# Patient Record
Sex: Female | Born: 1980 | Race: White | Hispanic: No | Smoking: Current every day smoker
Health system: Southern US, Community
[De-identification: ages and names within clinical notes are randomized; demographics above are authoritative.]

## PROBLEM LIST (undated history)

## (undated) DIAGNOSIS — R109 Unspecified abdominal pain: Secondary | ICD-10-CM

## (undated) DIAGNOSIS — F4310 Post-traumatic stress disorder, unspecified: Secondary | ICD-10-CM

## (undated) DIAGNOSIS — R42 Dizziness and giddiness: Secondary | ICD-10-CM

## (undated) DIAGNOSIS — F410 Panic disorder [episodic paroxysmal anxiety] without agoraphobia: Secondary | ICD-10-CM

## (undated) DIAGNOSIS — Z6841 Body Mass Index (BMI) 40.0 and over, adult: Secondary | ICD-10-CM

## (undated) DIAGNOSIS — N809 Endometriosis, unspecified: Secondary | ICD-10-CM

## (undated) DIAGNOSIS — F329 Major depressive disorder, single episode, unspecified: Secondary | ICD-10-CM

## (undated) DIAGNOSIS — J42 Unspecified chronic bronchitis: Secondary | ICD-10-CM

## (undated) DIAGNOSIS — F419 Anxiety disorder, unspecified: Secondary | ICD-10-CM

## (undated) HISTORY — DX: Morbid (severe) obesity due to excess calories: E66.01

## (undated) HISTORY — DX: Anxiety disorder, unspecified: F41.9

## (undated) HISTORY — DX: Dizziness and giddiness: R42

## (undated) HISTORY — DX: Unspecified abdominal pain: R10.9

## (undated) HISTORY — DX: Major depressive disorder, single episode, unspecified: F32.9

## (undated) HISTORY — DX: Body Mass Index (BMI) 40.0 and over, adult: Z684

---
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
## 2003-03-13 ENCOUNTER — Other Ambulatory Visit: Admission: RE | Admit: 2003-03-13 | Discharge: 2003-03-13 | Payer: Self-pay | Admitting: Gynecology

## 2003-06-04 HISTORY — PX: SALPINGECTOMY: SHX328

## 2003-06-14 ENCOUNTER — Ambulatory Visit (HOSPITAL_COMMUNITY): Admission: RE | Admit: 2003-06-14 | Discharge: 2003-06-14 | Payer: Self-pay | Admitting: Internal Medicine

## 2003-06-14 ENCOUNTER — Encounter (INDEPENDENT_AMBULATORY_CARE_PROVIDER_SITE_OTHER): Payer: Self-pay | Admitting: *Deleted

## 2004-09-09 ENCOUNTER — Emergency Department (HOSPITAL_COMMUNITY): Admission: EM | Admit: 2004-09-09 | Discharge: 2004-09-09 | Payer: Self-pay | Admitting: Emergency Medicine

## 2005-11-26 ENCOUNTER — Emergency Department (HOSPITAL_COMMUNITY): Admission: EM | Admit: 2005-11-26 | Discharge: 2005-11-26 | Payer: Self-pay | Admitting: Family Medicine

## 2005-11-30 ENCOUNTER — Emergency Department (HOSPITAL_COMMUNITY): Admission: EM | Admit: 2005-11-30 | Discharge: 2005-11-30 | Payer: Self-pay | Admitting: Family Medicine

## 2006-01-24 ENCOUNTER — Inpatient Hospital Stay (HOSPITAL_COMMUNITY): Admission: AD | Admit: 2006-01-24 | Discharge: 2006-01-25 | Payer: Self-pay | Admitting: Gynecology

## 2006-01-26 ENCOUNTER — Inpatient Hospital Stay (HOSPITAL_COMMUNITY): Admission: AD | Admit: 2006-01-26 | Discharge: 2006-01-26 | Payer: Self-pay | Admitting: Gynecology

## 2006-02-02 ENCOUNTER — Inpatient Hospital Stay (HOSPITAL_COMMUNITY): Admission: AD | Admit: 2006-02-02 | Discharge: 2006-02-02 | Payer: Self-pay | Admitting: Obstetrics and Gynecology

## 2006-02-08 ENCOUNTER — Emergency Department (HOSPITAL_COMMUNITY): Admission: EM | Admit: 2006-02-08 | Discharge: 2006-02-08 | Payer: Self-pay | Admitting: Emergency Medicine

## 2006-02-11 ENCOUNTER — Other Ambulatory Visit: Admission: RE | Admit: 2006-02-11 | Discharge: 2006-02-11 | Payer: Self-pay | Admitting: Gynecology

## 2006-03-26 ENCOUNTER — Emergency Department (HOSPITAL_COMMUNITY): Admission: EM | Admit: 2006-03-26 | Discharge: 2006-03-26 | Payer: Self-pay | Admitting: Emergency Medicine

## 2006-05-05 ENCOUNTER — Emergency Department (HOSPITAL_COMMUNITY): Admission: EM | Admit: 2006-05-05 | Discharge: 2006-05-05 | Payer: Self-pay | Admitting: Family Medicine

## 2006-05-11 ENCOUNTER — Emergency Department (HOSPITAL_COMMUNITY): Admission: EM | Admit: 2006-05-11 | Discharge: 2006-05-11 | Payer: Self-pay | Admitting: Family Medicine

## 2006-07-01 ENCOUNTER — Emergency Department (HOSPITAL_COMMUNITY): Admission: EM | Admit: 2006-07-01 | Discharge: 2006-07-01 | Payer: Self-pay | Admitting: Family Medicine

## 2006-07-04 ENCOUNTER — Emergency Department (HOSPITAL_COMMUNITY): Admission: EM | Admit: 2006-07-04 | Discharge: 2006-07-04 | Payer: Self-pay | Admitting: Emergency Medicine

## 2006-07-09 ENCOUNTER — Ambulatory Visit: Payer: Self-pay | Admitting: Family Medicine

## 2006-07-09 ENCOUNTER — Encounter (INDEPENDENT_AMBULATORY_CARE_PROVIDER_SITE_OTHER): Payer: Self-pay | Admitting: Internal Medicine

## 2006-07-15 ENCOUNTER — Ambulatory Visit: Payer: Self-pay | Admitting: Family Medicine

## 2006-09-23 ENCOUNTER — Emergency Department (HOSPITAL_COMMUNITY): Admission: EM | Admit: 2006-09-23 | Discharge: 2006-09-23 | Payer: Self-pay | Admitting: Family Medicine

## 2007-05-12 ENCOUNTER — Observation Stay: Payer: Self-pay | Admitting: Obstetrics & Gynecology

## 2007-07-03 ENCOUNTER — Inpatient Hospital Stay: Payer: Self-pay

## 2007-09-20 ENCOUNTER — Ambulatory Visit: Payer: Self-pay | Admitting: Family Medicine

## 2007-09-20 DIAGNOSIS — F329 Major depressive disorder, single episode, unspecified: Secondary | ICD-10-CM

## 2007-09-20 DIAGNOSIS — G43009 Migraine without aura, not intractable, without status migrainosus: Secondary | ICD-10-CM | POA: Insufficient documentation

## 2007-09-20 DIAGNOSIS — F3289 Other specified depressive episodes: Secondary | ICD-10-CM

## 2007-09-20 HISTORY — DX: Other specified depressive episodes: F32.89

## 2007-09-20 HISTORY — DX: Major depressive disorder, single episode, unspecified: F32.9

## 2007-09-21 ENCOUNTER — Telehealth (INDEPENDENT_AMBULATORY_CARE_PROVIDER_SITE_OTHER): Payer: Self-pay | Admitting: Internal Medicine

## 2007-09-27 ENCOUNTER — Ambulatory Visit: Payer: Self-pay | Admitting: Internal Medicine

## 2007-09-29 ENCOUNTER — Telehealth (INDEPENDENT_AMBULATORY_CARE_PROVIDER_SITE_OTHER): Payer: Self-pay | Admitting: *Deleted

## 2007-10-25 ENCOUNTER — Ambulatory Visit: Payer: Self-pay | Admitting: Family Medicine

## 2007-10-25 DIAGNOSIS — G4700 Insomnia, unspecified: Secondary | ICD-10-CM

## 2007-10-31 ENCOUNTER — Telehealth (INDEPENDENT_AMBULATORY_CARE_PROVIDER_SITE_OTHER): Payer: Self-pay | Admitting: Internal Medicine

## 2007-11-22 ENCOUNTER — Telehealth (INDEPENDENT_AMBULATORY_CARE_PROVIDER_SITE_OTHER): Payer: Self-pay | Admitting: Internal Medicine

## 2007-12-01 ENCOUNTER — Ambulatory Visit: Payer: Self-pay | Admitting: Family Medicine

## 2007-12-01 DIAGNOSIS — R062 Wheezing: Secondary | ICD-10-CM

## 2007-12-01 DIAGNOSIS — J069 Acute upper respiratory infection, unspecified: Secondary | ICD-10-CM | POA: Insufficient documentation

## 2007-12-02 ENCOUNTER — Telehealth: Payer: Self-pay | Admitting: Family Medicine

## 2007-12-05 ENCOUNTER — Telehealth (INDEPENDENT_AMBULATORY_CARE_PROVIDER_SITE_OTHER): Payer: Self-pay | Admitting: Internal Medicine

## 2007-12-07 DIAGNOSIS — N809 Endometriosis, unspecified: Secondary | ICD-10-CM | POA: Insufficient documentation

## 2007-12-07 DIAGNOSIS — J45909 Unspecified asthma, uncomplicated: Secondary | ICD-10-CM | POA: Insufficient documentation

## 2007-12-07 DIAGNOSIS — T782XXA Anaphylactic shock, unspecified, initial encounter: Secondary | ICD-10-CM

## 2007-12-08 ENCOUNTER — Telehealth (INDEPENDENT_AMBULATORY_CARE_PROVIDER_SITE_OTHER): Payer: Self-pay | Admitting: Internal Medicine

## 2007-12-12 ENCOUNTER — Encounter: Payer: Self-pay | Admitting: Family Medicine

## 2008-01-04 ENCOUNTER — Ambulatory Visit: Payer: Self-pay | Admitting: Family Medicine

## 2008-01-04 DIAGNOSIS — J029 Acute pharyngitis, unspecified: Secondary | ICD-10-CM | POA: Insufficient documentation

## 2008-02-01 ENCOUNTER — Telehealth (INDEPENDENT_AMBULATORY_CARE_PROVIDER_SITE_OTHER): Payer: Self-pay | Admitting: Internal Medicine

## 2008-02-08 ENCOUNTER — Telehealth (INDEPENDENT_AMBULATORY_CARE_PROVIDER_SITE_OTHER): Payer: Self-pay | Admitting: Internal Medicine

## 2008-02-08 ENCOUNTER — Encounter (INDEPENDENT_AMBULATORY_CARE_PROVIDER_SITE_OTHER): Payer: Self-pay | Admitting: Internal Medicine

## 2008-03-06 ENCOUNTER — Ambulatory Visit: Payer: Self-pay | Admitting: Family Medicine

## 2008-04-10 ENCOUNTER — Encounter (INDEPENDENT_AMBULATORY_CARE_PROVIDER_SITE_OTHER): Payer: Self-pay | Admitting: *Deleted

## 2008-04-12 ENCOUNTER — Telehealth (INDEPENDENT_AMBULATORY_CARE_PROVIDER_SITE_OTHER): Payer: Self-pay | Admitting: Internal Medicine

## 2008-04-12 ENCOUNTER — Encounter (INDEPENDENT_AMBULATORY_CARE_PROVIDER_SITE_OTHER): Payer: Self-pay | Admitting: Internal Medicine

## 2008-05-21 ENCOUNTER — Telehealth: Payer: Self-pay | Admitting: Family Medicine

## 2008-05-28 ENCOUNTER — Telehealth: Payer: Self-pay | Admitting: Family Medicine

## 2008-06-06 ENCOUNTER — Emergency Department: Payer: Self-pay | Admitting: Emergency Medicine

## 2008-11-13 ENCOUNTER — Ambulatory Visit: Payer: Self-pay | Admitting: Family Medicine

## 2008-12-03 ENCOUNTER — Encounter: Payer: Self-pay | Admitting: Family Medicine

## 2009-01-01 ENCOUNTER — Encounter: Payer: Self-pay | Admitting: Family Medicine

## 2009-01-24 ENCOUNTER — Emergency Department (HOSPITAL_COMMUNITY): Admission: EM | Admit: 2009-01-24 | Discharge: 2009-01-24 | Payer: Self-pay | Admitting: Family Medicine

## 2009-01-31 ENCOUNTER — Encounter: Payer: Self-pay | Admitting: Family Medicine

## 2009-04-09 ENCOUNTER — Emergency Department (HOSPITAL_COMMUNITY): Admission: EM | Admit: 2009-04-09 | Discharge: 2009-04-09 | Payer: Self-pay | Admitting: Emergency Medicine

## 2009-09-19 ENCOUNTER — Emergency Department (HOSPITAL_COMMUNITY): Admission: EM | Admit: 2009-09-19 | Discharge: 2009-09-19 | Payer: Self-pay | Admitting: Family Medicine

## 2009-09-20 ENCOUNTER — Ambulatory Visit: Payer: Self-pay | Admitting: Family Medicine

## 2009-10-23 ENCOUNTER — Ambulatory Visit: Payer: Self-pay | Admitting: Family Medicine

## 2010-01-01 HISTORY — PX: CHOLECYSTECTOMY: SHX55

## 2010-01-30 ENCOUNTER — Inpatient Hospital Stay (HOSPITAL_COMMUNITY): Admission: EM | Admit: 2010-01-30 | Discharge: 2010-02-01 | Payer: Self-pay | Admitting: Emergency Medicine

## 2010-01-31 ENCOUNTER — Encounter (INDEPENDENT_AMBULATORY_CARE_PROVIDER_SITE_OTHER): Payer: Self-pay | Admitting: Surgery

## 2010-06-10 ENCOUNTER — Emergency Department (HOSPITAL_COMMUNITY): Admission: EM | Admit: 2010-06-10 | Discharge: 2010-06-10 | Payer: Self-pay | Admitting: Family Medicine

## 2010-07-25 ENCOUNTER — Emergency Department (HOSPITAL_COMMUNITY)
Admission: EM | Admit: 2010-07-25 | Discharge: 2010-07-25 | Payer: Self-pay | Source: Home / Self Care | Admitting: Emergency Medicine

## 2010-10-06 ENCOUNTER — Emergency Department (HOSPITAL_COMMUNITY)
Admission: EM | Admit: 2010-10-06 | Discharge: 2010-10-06 | Disposition: A | Discharge status: Home / Self Care | Payer: BC Managed Care – PPO | Attending: Emergency Medicine | Admitting: Emergency Medicine

## 2010-10-06 ENCOUNTER — Emergency Department (HOSPITAL_COMMUNITY): Payer: BC Managed Care – PPO

## 2010-10-06 DIAGNOSIS — N949 Unspecified condition associated with female genital organs and menstrual cycle: Secondary | ICD-10-CM | POA: Insufficient documentation

## 2010-10-06 DIAGNOSIS — N898 Other specified noninflammatory disorders of vagina: Secondary | ICD-10-CM | POA: Insufficient documentation

## 2010-10-06 LAB — URINALYSIS, ROUTINE W REFLEX MICROSCOPIC
Ketones, ur: NEGATIVE mg/dL
Leukocytes, UA: NEGATIVE
Nitrite: NEGATIVE
Protein, ur: NEGATIVE mg/dL

## 2010-10-06 LAB — PREGNANCY, URINE: Preg Test, Ur: NEGATIVE

## 2010-10-06 LAB — WET PREP, GENITAL: Clue Cells Wet Prep HPF POC: NONE SEEN

## 2010-10-09 LAB — GC/CHLAMYDIA PROBE AMP, GENITAL: Chlamydia, DNA Probe: NEGATIVE

## 2010-10-14 LAB — POCT URINALYSIS DIPSTICK
Glucose, UA: NEGATIVE mg/dL
Nitrite: NEGATIVE
Urobilinogen, UA: 0.2 mg/dL (ref 0.0–1.0)

## 2010-10-14 LAB — POCT PREGNANCY, URINE: Preg Test, Ur: NEGATIVE

## 2010-10-19 LAB — COMPREHENSIVE METABOLIC PANEL
ALT: 13 U/L (ref 0–35)
AST: 16 U/L (ref 0–37)
Albumin: 2.9 g/dL — ABNORMAL LOW (ref 3.5–5.2)
Alkaline Phosphatase: 40 U/L (ref 39–117)
BUN: 3 mg/dL — ABNORMAL LOW (ref 6–23)
CO2: 26 mEq/L (ref 19–32)
Calcium: 7.9 mg/dL — ABNORMAL LOW (ref 8.4–10.5)
Chloride: 106 mEq/L (ref 96–112)
Creatinine, Ser: 0.82 mg/dL (ref 0.4–1.2)
GFR calc Af Amer: >60 mL/min (ref >60)
GFR calc non Af Amer: >60 mL/min (ref >60)
Glucose, Bld: 110 mg/dL — ABNORMAL HIGH (ref 70–99)
Potassium: 4.4 mEq/L (ref 3.5–5.1)
Sodium: 138 mEq/L (ref 135–145)
Total Bilirubin: 0.3 mg/dL (ref 0.3–1.2)
Total Protein: 5.2 g/dL — ABNORMAL LOW (ref 6.0–8.3)

## 2010-10-19 LAB — URINALYSIS, ROUTINE W REFLEX MICROSCOPIC
Hgb urine dipstick: NEGATIVE
Ketones, ur: 15 mg/dL — AB
Protein, ur: NEGATIVE mg/dL
Urobilinogen, UA: 1 mg/dL (ref 0.0–1.0)

## 2010-10-19 LAB — CBC
HCT: 39.2 % (ref 36.0–46.0)
Hemoglobin: 13.5 g/dL (ref 12.0–15.0)
MCH: 31.3 pg (ref 26.0–34.0)
MCV: 91.2 fL (ref 78.0–100.0)
MCV: 90.7 fL (ref 78.0–100.0)
Platelets: 251 10*3/uL (ref 150–400)
Platelets: 298 10*3/uL (ref 150–400)
RBC: 4.32 MIL/uL (ref 3.87–5.11)
RDW: 13.7 % (ref 11.5–15.5)
WBC: 8.4 10*3/uL (ref 4.0–10.5)

## 2010-10-19 LAB — LIPASE, BLOOD: Lipase: 34 U/L (ref 11–59)

## 2010-10-19 LAB — DIFFERENTIAL
Basophils Absolute: 0 10*3/uL (ref 0.0–0.1)
Eosinophils Absolute: 0.2 10*3/uL (ref 0.0–0.7)
Eosinophils Relative: 2 % (ref 0–5)
Neutrophils Relative %: 64 % (ref 43–77)

## 2010-10-19 LAB — POCT PREGNANCY, URINE: Preg Test, Ur: NEGATIVE

## 2010-10-22 LAB — POCT URINALYSIS DIP (DEVICE)
Nitrite: NEGATIVE
Protein, ur: 100 mg/dL — AB
Urobilinogen, UA: 1 mg/dL (ref 0.0–1.0)
pH: 5.5 (ref 5.0–8.0)

## 2010-10-22 LAB — CBC
MCHC: 34.5 g/dL (ref 30.0–36.0)
MCV: 89.1 fL (ref 78.0–100.0)
RBC: 4.57 MIL/uL (ref 3.87–5.11)
RDW: 14 % (ref 11.5–15.5)

## 2010-10-22 LAB — DIFFERENTIAL
Basophils Relative: 0 % (ref 0–1)
Eosinophils Absolute: 0.2 10*3/uL (ref 0.0–0.7)
Monocytes Relative: 8 % (ref 3–12)
Neutrophils Relative %: 64 % (ref 43–77)

## 2010-10-22 LAB — POCT I-STAT, CHEM 8
Calcium, Ion: 1.13 mmol/L (ref 1.12–1.32)
Glucose, Bld: 92 mg/dL (ref 70–99)
HCT: 43 % (ref 36.0–46.0)
Hemoglobin: 14.6 g/dL (ref 12.0–15.0)
Potassium: 3.9 mEq/L (ref 3.5–5.1)

## 2010-11-10 LAB — POCT URINALYSIS DIP (DEVICE)
Protein, ur: 100 mg/dL — AB
Urobilinogen, UA: 1 mg/dL (ref 0.0–1.0)

## 2010-11-10 LAB — POCT PREGNANCY, URINE: Preg Test, Ur: NEGATIVE

## 2010-12-02 HISTORY — PX: TOTAL ABDOMINAL HYSTERECTOMY: SHX209

## 2010-12-04 ENCOUNTER — Other Ambulatory Visit: Payer: Self-pay | Admitting: Obstetrics and Gynecology

## 2010-12-04 ENCOUNTER — Encounter (HOSPITAL_COMMUNITY): Payer: BC Managed Care – PPO

## 2010-12-04 LAB — CBC
HCT: 40.6 % (ref 36.0–46.0)
Hemoglobin: 13.5 g/dL (ref 12.0–15.0)
MCV: 92.3 fL (ref 78.0–100.0)
RBC: 4.4 MIL/uL (ref 3.87–5.11)
WBC: 7.9 10*3/uL (ref 4.0–10.5)

## 2010-12-11 ENCOUNTER — Inpatient Hospital Stay (HOSPITAL_COMMUNITY)
Admission: RE | Admit: 2010-12-11 | Discharge: 2010-12-13 | DRG: 359 | Disposition: A | Discharge status: Home / Self Care | Payer: BC Managed Care – PPO | Source: Ambulatory Visit | Attending: Obstetrics and Gynecology | Admitting: Obstetrics and Gynecology

## 2010-12-11 ENCOUNTER — Other Ambulatory Visit: Payer: Self-pay | Admitting: Obstetrics and Gynecology

## 2010-12-11 DIAGNOSIS — Z01812 Encounter for preprocedural laboratory examination: Secondary | ICD-10-CM

## 2010-12-11 DIAGNOSIS — N920 Excessive and frequent menstruation with regular cycle: Secondary | ICD-10-CM | POA: Diagnosis present

## 2010-12-11 DIAGNOSIS — N803 Endometriosis of pelvic peritoneum, unspecified: Secondary | ICD-10-CM | POA: Diagnosis present

## 2010-12-11 DIAGNOSIS — N949 Unspecified condition associated with female genital organs and menstrual cycle: Secondary | ICD-10-CM | POA: Diagnosis present

## 2010-12-11 DIAGNOSIS — N946 Dysmenorrhea, unspecified: Principal | ICD-10-CM | POA: Diagnosis present

## 2010-12-11 DIAGNOSIS — Z01818 Encounter for other preprocedural examination: Secondary | ICD-10-CM

## 2010-12-11 LAB — HCG, SERUM, QUALITATIVE: Preg, Serum: NEGATIVE

## 2010-12-12 LAB — CBC
HCT: 39.2 % (ref 36.0–46.0)
Hemoglobin: 12.4 g/dL (ref 12.0–15.0)
WBC: 7.9 10*3/uL (ref 4.0–10.5)

## 2010-12-12 NOTE — Op Note (Signed)
Angie [name], Angie [name]                 ACCOUNT NO.:  [id number]  MEDICAL RECORD NO.:  [id number]           PATIENT TYPE:  I  LOCATION:  9309                          FACILITY:  [facility]  PHYSICIAN:  Lenoard Aden, M.D.DATE OF BIRTH:  01/20/1981  DATE OF PROCEDURE:  12/11/2010 DATE OF DISCHARGE:                              OPERATIVE REPORT   PREOPERATIVE DIAGNOSES: 1. Dysmenorrhea. 2. Menorrhagia. 3. History of pelvic pain. 4. History of endometriosis. 5. Previous cesarean section. 6. Desire for definitive therapy.  POSTOPERATIVE DIAGNOSES: 1. Dysmenorrhea. 2. Menorrhagia. 3. History of pelvic pain. 4. History of endometriosis. 5. Previous cesarean section. 6. Desire for definitive therapy. 7. Dense pelvic adhesions.  PROCEDURE:  Total abdominal hysterectomy.  SURGEON:  Lenoard Aden, MD  ASSISTANT:  Lendon Colonel, MD  ANESTHESIA:  General.  ESTIMATED BLOOD LOSS:  100 mL.  COMPLICATIONS:  None.  DRAINS:  Foley.  COUNTS:  Correct.  The patient went to recovery in good condition.  BRIEF OPERATIVE NOTE:  After being apprised of risks of anesthesia, infection, bleeding, injury to abdominal organs, need for repair, delayed versus immediate complications, including bowel and bladder injury, possible need for repair, the patient has refused all minimally invasive surgical approaches to include robotic hysterectomy or laparoscopic hysterectomy.  She is, therefore, brought to the operating room for abdominal hysterectomy, whereby she is put under general anesthesia, prepped and draped in usual sterile fashion.  Foley catheter was placed.  After achieving adequate anesthesia and dilute Marcaine solution was placed, a Pfannenstiel incision skin incision was made with scalpel, carried down to the fascia which was nicked in the midline and opened transverse using Mayo scissors.  Rectus muscles were dissected sharply in the midline.  Dense adhesions of the omentum to  the anterior abdominal wall which were lysed sharply.  There were adhesions of the left sigmoid mesentery and to the left adnexa where previous LSCS occurred.  These were lysed sharply and entry into the abdomen was otherwise uncomplicated.  There are also adhesions from bladder flap to the anterior peritoneum.  At this time, Balfour retractor was placed. Packing was performed and uterus was elevated using Kelly clamps.  The round ligaments were bilaterally grasped, suture ligated, and divided. Retroperitoneal space was entered, uterine vessels skeletonized on the left, and clamped with the LigaSure but not cut.  On the right side, the tubo-ovarian round ligament is clamped, cut, and divided.  At this time, the attention was turned to the bladder flap which was densely adherent. The bladder was filled with a dilute indigo carmine solution identifying the cephalad extent of the bladder and the adhesions were sharply lysed along the right lateral fundal area down to the level where the bladder flap was better developed and at this time, the bladder was refilled with indigo carmine and found to be well dissected out of the field. Uterine vessels were then skeletonized bilaterally and divided using LigaSure.  Progressive bites down the cardinal and broad ligament complexes were divided using ligature.  Uterosacrals were then grasped bilaterally with vaginal clamps and suture ligated.  The vaginal cuff is encountered  and entered prior to vaginal entry.  Please note that the ureters were identified previously and noted to be peristalsing normally bilaterally.  Vaginal entry was made.  The specimen was removed.  The vagina is inspected and closed with interrupted sutures of 0 Monocryl suture.  Good hemostasis was noted.  Irrigation accomplished.  Bladder flap was inspected and found to be hemostatic.  Urine output was excellent, and urine is clear.  At this time, irrigation was  further accomplished.  Right ovary is inspected and found to be hemostatic. Good hemostasis was assured.  All packings were removed, retractor was removed.  Fascia was then closed using 0 Monocryl in a continuous running fashion.  Subcutaneous tissue reapproximated using 0 plain, and skin closed using staples.  The patient tolerated the procedure well and was transferred to recovery in good condition.     Lenoard Aden, M.D.     RJT/MEDQ  D:  12/11/2010  T:  12/12/2010  Job:  [id number]  Electronically Signed by Olivia Mackie M.D. on 12/12/2010 11:09:03 AM

## 2010-12-12 NOTE — H&P (Signed)
  NAMEJENESSA, Angie [name]                 ACCOUNT NO.:  [id number]  MEDICAL RECORD NO.:  [id number]           PATIENT TYPE:  LOCATION:                                 FACILITY:  PHYSICIAN:  Lenoard Aden, M.D.DATE OF BIRTH:  Nov 26, 1980  DATE OF ADMISSION:  12/11/2010 DATE OF DISCHARGE:                             HISTORY & PHYSICAL   CHIEF COMPLAINT:  Dysmenorrhea and menorrhagia, history of endometriosis for definitive therapy.  HISTORY OF PRESENT ILLNESS:  This is a 30 year old white female, G2, P1 with history of known probable endometriosis, pelvic adhesions status post laparoscopic left S and O in 2004, status post C-section who presents for definitive care.  The patient has known endometriosis and requires chronic pain medications in the form of Norco and Ultram use on a daily basis.  She has been seen by gastrointestinal consultation and GU consultation with no other etiology for her pain.  Previous surgery has shown evidence of severe pelvic adhesions.  ALLERGIES:  DOXYCYCLINE, AUGMENTIN, and PREDNISONE.  MEDICATIONS:  Norco p.r.n., Ultram p.r.n., Adderall, Xanax p.r.n., and Ultram as needed.  FAMILY HISTORY:  Noncontributory.  PREGNANT HISTORY:  Remarkable for 1 miscarriage and 1 C-section.  PAST SURGICAL HISTORY:  Remarkable for cholecystectomy and LSO as noted.  PHYSICAL EXAMINATION:  GENERAL:  She is a well-developed, well-nourished female in no acute distress. HEENT:  Normal. NECK:  Supple.  Full range of motion. LUNGS:  Clear. HEART:  Regular rate and rhythm. ABDOMEN:  Soft, nontender. PELVIC:  Again reveals a tender uterus, no adnexal masses.  IMPRESSION:  History of pelvic endometriosis, history of dysmenorrhea, menorrhagia with no future desire of childbearing.  The patient desires definitive therapy.  The patient declines minimally invasive surgery in the form of laparoscopic or robotic hysterectomy.  She desires abdominal hysterectomy.  PLAN:   Proceed with total abdominal hysterectomy with lysis of adhesions.  Risks of anesthesia, infection, bleeding, injury to intraabdominal organs with need for repair discussed.  Delayed versus immediate complications to include bowel and bladder injury noted.  Inability to cure pelvic pain discussed.  The patient acknowledges and wishes to proceed.     Lenoard Aden, M.D.     RJT/MEDQ  D:  12/10/2010  T:  12/10/2010  Job:  [id number]  Electronically Signed by Olivia Mackie M.D. on 12/12/2010 11:08:58 AM

## 2010-12-15 NOTE — Discharge Summary (Signed)
  Angie [name], Angie [name]                 ACCOUNT NO.:  [id number]  MEDICAL RECORD NO.:  [id number]           PATIENT TYPE:  I  LOCATION:  9309                          [facility]ILITY:  [facility]  PHYSICIAN:  Lenoard Aden, M.D.DATE OF BIRTH:  12/06/1980  DATE OF ADMISSION:  12/11/2010 DATE OF DISCHARGE:  12/13/2010                              DISCHARGE SUMMARY   ADMISSION DIAGNOSES:  Severe dysmenorrhea, menorrhagia, pelvic pain with a history of endometriosis.  POSTOPERATIVE DIAGNOSES:  Severe dysmenorrhea, menorrhagia, pelvic pain with a history of endometriosis.  HOSPITAL COURSE:  The patient underwent uncomplicated total abdominal hysterectomy, lysis of adhesions on Dec 11, 2010.  Postoperative course uncomplicated.  Postoperative hemoglobin 12.4, remained afebrile. Tolerated regular diet well.  Ambulated without difficulty.  Was discharged to home on hospital day #2.  Discharge teaching done. Discharge medications to include Vicodin 7.5, #50.  Follow up in the office in 1 week for staple removal.     Lenoard Aden, M.D.     RJT/MEDQ  D:  12/13/2010  T:  12/13/2010  Job:  [id number]  Electronically Signed by Olivia Mackie M.D. on 12/15/2010 10:20:47 PM

## 2010-12-17 ENCOUNTER — Emergency Department (HOSPITAL_COMMUNITY)
Admission: EM | Admit: 2010-12-17 | Discharge: 2010-12-17 | Disposition: A | Discharge status: Other Acute Inpatient Hospital | Payer: BC Managed Care – PPO | Attending: Emergency Medicine | Admitting: Emergency Medicine

## 2010-12-17 ENCOUNTER — Emergency Department (HOSPITAL_COMMUNITY): Payer: BC Managed Care – PPO

## 2010-12-17 DIAGNOSIS — R109 Unspecified abdominal pain: Secondary | ICD-10-CM | POA: Insufficient documentation

## 2010-12-17 DIAGNOSIS — R61 Generalized hyperhidrosis: Secondary | ICD-10-CM | POA: Insufficient documentation

## 2010-12-17 DIAGNOSIS — J45909 Unspecified asthma, uncomplicated: Secondary | ICD-10-CM | POA: Insufficient documentation

## 2010-12-17 DIAGNOSIS — R0602 Shortness of breath: Secondary | ICD-10-CM | POA: Insufficient documentation

## 2010-12-17 DIAGNOSIS — Z79899 Other long term (current) drug therapy: Secondary | ICD-10-CM | POA: Insufficient documentation

## 2010-12-17 DIAGNOSIS — R059 Cough, unspecified: Secondary | ICD-10-CM | POA: Insufficient documentation

## 2010-12-17 DIAGNOSIS — R079 Chest pain, unspecified: Secondary | ICD-10-CM | POA: Insufficient documentation

## 2010-12-17 DIAGNOSIS — F988 Other specified behavioral and emotional disorders with onset usually occurring in childhood and adolescence: Secondary | ICD-10-CM | POA: Insufficient documentation

## 2010-12-17 DIAGNOSIS — R05 Cough: Secondary | ICD-10-CM | POA: Insufficient documentation

## 2010-12-17 DIAGNOSIS — R55 Syncope and collapse: Secondary | ICD-10-CM | POA: Insufficient documentation

## 2010-12-17 DIAGNOSIS — R0609 Other forms of dyspnea: Secondary | ICD-10-CM | POA: Insufficient documentation

## 2010-12-17 DIAGNOSIS — R0989 Other specified symptoms and signs involving the circulatory and respiratory systems: Secondary | ICD-10-CM | POA: Insufficient documentation

## 2010-12-17 DIAGNOSIS — R42 Dizziness and giddiness: Secondary | ICD-10-CM | POA: Insufficient documentation

## 2010-12-17 DIAGNOSIS — R6883 Chills (without fever): Secondary | ICD-10-CM | POA: Insufficient documentation

## 2010-12-17 LAB — DIFFERENTIAL
Basophils Absolute: 0 10*3/uL (ref 0.0–0.1)
Lymphocytes Relative: 12 % (ref 12–46)
Monocytes Absolute: 0.6 10*3/uL (ref 0.1–1.0)
Monocytes Relative: 4 % (ref 3–12)
Neutro Abs: 11.3 10*3/uL — ABNORMAL HIGH (ref 1.7–7.7)

## 2010-12-17 LAB — ACETAMINOPHEN LEVEL: Acetaminophen (Tylenol), Serum: <15 ug/mL (ref 10–30)

## 2010-12-17 LAB — POCT I-STAT, CHEM 8
BUN: 10 mg/dL (ref 6–23)
Chloride: 102 mEq/L (ref 96–112)
Creatinine, Ser: 0.8 mg/dL (ref 0.4–1.2)
Sodium: 137 mEq/L (ref 135–145)
TCO2: 24 mmol/L (ref 0–100)

## 2010-12-17 LAB — POCT CARDIAC MARKERS
CKMB, poc: <1 ng/mL — ABNORMAL LOW (ref 1.0–8.0)
CKMB, poc: <1 ng/mL — ABNORMAL LOW (ref 1.0–8.0)
Myoglobin, poc: 38.1 ng/mL (ref 12–200)
Myoglobin, poc: 31.8 ng/mL (ref 12–200)
Troponin i, poc: <0.05 ng/mL (ref 0.00–0.09)

## 2010-12-17 LAB — CBC
HCT: 44.8 % (ref 36.0–46.0)
Hemoglobin: 15.7 g/dL — ABNORMAL HIGH (ref 12.0–15.0)
MCHC: 35 g/dL (ref 30.0–36.0)
RBC: 4.97 MIL/uL (ref 3.87–5.11)

## 2010-12-17 LAB — CK TOTAL AND CKMB (NOT AT ARMC)
Relative Index: INVALID (ref 0.0–2.5)
Total CK: 25 U/L (ref 7–177)

## 2010-12-17 LAB — TROPONIN I: Troponin I: <0.3 ng/mL (ref <0.30)

## 2010-12-17 MED ORDER — IOHEXOL 300 MG/ML  SOLN: 80.0000 mL | Freq: Once | INTRAMUSCULAR | Status: DC | PRN

## 2010-12-19 NOTE — Assessment & Plan Note (Signed)
[facility]                                 ON-CALL NOTE   NAME:[name], Angie LIAS                        MRN:          [id number]  DATE:07/05/2006                            DOB:          [birth year]    TELEPHONE NUMBER:  [phone].   SUBJECTIVE:  Severe sore throat. The patient saw Marlise Eves today, and  was diagnosed with a viral infection. Her sore throat is not improving  with Chloroseptic spray, or ibuprofen 800 mg.   PLAN:  Called in, first mouthwash with lidocaine, for gargling and  partially swallowing to [facility] at [phone]. If sore throat  continues, she may call the office for further suggestions.     Kerby Nora, MD  Electronically Signed    AB/MedQ  DD: 07/15/2006  DT: 07/16/2006  Job #: [id number]

## 2010-12-19 NOTE — H&P (Signed)
NAMEMarland [name]  ZIONA, WICKENS                             ACCOUNT NO.:  [id number]   MEDICAL RECORD NO.:  [id number]                   PATIENT TYPE:  AMB   LOCATION:  SDC                                  [facility]ILITY:  [facility]   PHYSICIAN:  Juan H. Lily Peer, M.D.             DATE OF BIRTH:  1981-07-12   DATE OF ADMISSION:  06/14/2003  DATE OF DISCHARGE:                                HISTORY & PHYSICAL   CHIEF COMPLAINT:  Chronic worsening left lower quadrant pain.   HISTORY OF PRESENT ILLNESS:  The patient is a 30 year old gravida 0 who was  seen in the office on March 13, 2003 and she had not been seen in our  office for 3 years prior to that. [place] had [place] out of the [place] and since  that time that I had seen her, she had been on Ortho-Tri-Cyclen 28 day oral  contraceptive pills and was also taking Cataflam for dysmenorrhea. She has  stated that after living in [place] for 2 years, that she had excruciating  left lower quadrant pain and she was taken for an exploratory laparotomy in  an emergency situation and they were suspecting an adnexal torsion and was  found to have endometriosis and was treated with laser. In November of 2003,  she had laparoscopy for acute left lower quadrant pain and had pelvic  adhesiolysis that was done as well. She has been placed on Lupron for 6  months and her symptoms of bloating and left lower quadrant pain had  returned with no frequent pattern and no dyspareunia reported. Her periods  have sometimes been heavy but no inter-menstrual bleeding have been reported  and she stated that while she was on the Ortho-Tri-Cyclen in the past, it  did not help any of her symptoms. She wanted to return back to some form of  contraception, so we talked about the NuvaRing and then give it a trial for  3 or 4 months and if her symptoms were to persist, that we would need to re-  laparoscoping her and probably consider a left adnexectomy. She did have an  ultrasound here in the  office in August of this year and the ultrasound was  completely normal and again, she presented to the office on June 11, 2003  complaining of worsening left lower quadrant pain. Had an ultrasound here in  our office which was completely normal but she was tender with rebound in  her left lower quadrant and teary eyed that an adequate pelvic assessment  was not able to be made and this is the reason for the ultrasound. With  these findings, she had opted to go ahead and proceed with a laparoscopy and  has given consent to remove possibly her left ovary and possible left tube  and ovary.   ALLERGIES:  No known drug allergies.   PAST MEDICAL HISTORY:  She has had  1 laparoscopy, 1 exploratory laparotomy  with pelvic adhesiolysis, and also treatment for endometriosis with laser.  She is currently on the NuvaRing for contraception.   FAMILY HISTORY:  Father's side with diabetes. Grandmother with hypertension.  Grandmother with some form of either ovarian or uterine cancer and heart  disease in her grandmother.   PHYSICAL EXAMINATION:  VITAL SIGNS:  Weight 216 1/2 pounds. Height 5 feet 2  inches tall. Blood pressure 120/68.  HEENT:  Unremarkable.  NECK:  Supple. Trachea midline. No carotid bruits. No thyromegaly.  LUNGS:  Clear to auscultation without rhonchi or wheezes.  HEART:  Regular rate and rhythm. No murmurs or gallop.  BREAST:  Examination was done at time of her annual examination in August  which this year was normal.  ABDOMEN:  Tenderness in the left lower quadrant.  PELVIC:  Examination is described above.  EXTREMITIES:  No clonus. No edema.   ASSESSMENT:  A 30 year old Gravida 0 with chronic left lower quadrant pain  over the past 2 or 3 years. Has had 2 operations consisting of laparoscopy,  laparotomy, pelvic adhesiolysis, laser treatment for endometriosis, hormonal  manipulation with GnRH analogs such as Lupron for 6 months, unsuccessful in  relieving her symptoms.  Also oral contraceptive pills and now the NuvaRing  and patient continues to have excruciating left lower quadrant pain despite  a normal ultrasound. The patient will be taken to the operating room on  Thursday, June 14, 2003 for diagnostic laparoscopy. The patient has  given consent to remove the left ovary and possibly the left tube and ovary,  regardless of findings due to her chronic pain. She denies any abnormality  in urination or bowel movements. Her recent pap smear was normal. Her  urinalysis recently was non-specific. The patient wishes to retain her  reproductive capability, so will treat any endometriosis and laser any  endometriotic implants with the removal of the tube and/or tube and ovary.  The patient understands the risk of the operation to include infection,  bleeding, trauma to internal organs, the event of technical difficulty that  laparoscopy will not allow [place] to gain entrance into the pelvic cavity or to  technical difficulty. That she is also aware that we will have to do an open  laparotomy which will require her to stay an additional day or two in the  hospital. Potential risks or trauma to internal organs from the instruments  or from the laser, requiring corrective surgery at that time. There is a  risk of infection although she will receive prophylactic antibiotics. The  risk for deep vein thrombosis. Anesthesia to discuss the potential  complications from the anesthesia at the time of preoperative consultation  by the anesthesia service. All of the above questions were answered. She is  fully aware that despite maybe removing her tube and ovary, that she may  continue to have left lower quadrant pain and if that is the case, then she  probably will need to have a GI evaluation and/or GU evaluation as well.   PLAN:  The patient is scheduled for diagnostic laparoscopy, left  oophorectomy, possible left salpingo-oophorectomy tomorrow Thursday, June 14, 2003 at 2:15 p.m. at [facility].                                               [place] H. Lily Peer, M.D.  JHF/MEDQ  D:  06/13/2003  T:  06/13/2003  Job:  [id number]

## 2010-12-19 NOTE — Op Note (Signed)
NAMEMarland [name]  MIKU, UDALL                             ACCOUNT NO.:  [id number]   MEDICAL RECORD NO.:  [id number]                   PATIENT TYPE:  AMB   LOCATION:  SDC                                  [facility]:  [facility]   PHYSICIAN:  Juan H. Lily Peer, M.D.             DATE OF BIRTH:  1981-07-08   DATE OF PROCEDURE:  06/14/2003  DATE OF DISCHARGE:                                 OPERATIVE REPORT   INDICATIONS FOR PROCEDURE:  A 30 year old gravida 0 with chronic left lower  quadrant pain, previous laparotomy and subsequent laparoscopy for chronic  left lower quadrant pain in the past; with the diagnosis of endometriosis  having been made.  Recent ultrasound with no abnormalities noted, but due to  the acute nature of her pain the patient was taken to the operating room for  a diagnostic laparoscopy, possible pelvic adhesiolysis, and possible left  salpingo-oophorectomy and possible ablation of endometriotic implants.   PREOPERATIVE DIAGNOSES:  1. Chronic left lower quadrant pain .  2. Past history of endometriosis.  3. Rule out recurrent endometriosis.   POSTOPERATIVE DIAGNOSES:  1. Chronic left lower quadrant pain.  2. Pelvic adhesions.  3. Left tubo-ovarian complex with inflammatory changes.   ANESTHESIA:  General endotracheal anesthesia.   PROCEDURE PERFORMED:  1. Diagnostic laparoscopy.  2. Lysis of pelvic adhesions.  3. Left salpingo-oophorectomy.   DESCRIPTION OF PROCEDURE:  After the patient was adequately counseled, she  was taken to the operating room, where she underwent successful  general  endotracheal anesthesia.  She was placed in the low lithotomy position and  the abdomen, vagina and perineum were prepped and draped in the usual  sterile fashion.  My examination under anesthesia demonstrated slightly  anteverted uterus.  A single-toothed tenaculum was placed on the anterior  cervical lip.  A Hulka tenaculum was placed after the uterus was sounded for  appropriate  orientation of the uterus.  After the Hulka tenaculum was in  place, the single-toothed tenaculum was removed.  The drapes were in place.  A red rubber Roxan Hockey had been inserted to evacuate the bladder of its  contents for approximately 50 cc.  After the drapes were in place a small  stab incision was made in the subumbilical region, whereby the Veress needle  was inserted and approximately 2.5 L of carbon dioxide were insufflated into  the inner abdominal cavity.  Prior to insufflation, the intraperitoneal  pressure was 10 mmHg.  Once the 2.5 L of carbon dioxide were insufflated in  through the peritoneal cavity, the Veress needle was removed.  A 10 mm  trocar was inserted, and then the sleeve was left in place; the trocar was  removed, and the operative laparoscope was inserted.   The patient was placed in the Trendelenburg position.  Two additional ports  were made four fingerbreadths from the midline on the left directly over the  symphysis  pubis, whereby a 5 mm trocar was inserted under laparoscopic  guidance.  On the contralateral side, approximately four fingerbreadths from  the midline and four fingerbreadths from the symphysis pubis.  A 10-11  trocar was introduced under laparoscopic guidance.   In a systematic fashion, before the pelvis cavity was able to assessed,  pelvic adhesiolysis was accomplished, in effort to be able the left tube and  ovary(Which appeared to be encased together as one complex unit.) The  omentum had be freed; from the left fallopian tube with the tripolar  coagulation cutting unit.  The left utero-ovarian ligament was clamped,  cauterized and cut; followed by the left fallopian tube was clamped,  cauterized and cut as well.  Once the omentum was freed from the tube and  ovary. the left infundibulopelvic ligament was inspected, and with the  tripolar unit was the grass coagulated and transected until the left tube  and ovary was freed of its attachment.   The pelvic cavity was then copiously  irrigated with normal saline solution.  A systematic inspection of the  entire pelvic cavity demonstrated a small Master's window in the cul-de-sac,  but no endometriotic implant was noted.  The right tube and ovary were  inspected.  The ovary was flipped on its undersurface, there was no evidence  of any endometriosis.  The anterior cul-de-sac was clear of adhesions or  endometriosis.  Then the left tube and ovary was retrieved through the 10/11  mm trocar port on the patient's right.  It was removed in a specimen bag  intraabdominally and retrieve without spillage; sent off for his  histological evaluation.  The carbon dioxide was removed from the peritoneal  cavity.  Inspection of all pedicles demonstrated good hemostasis.  The  instruments were then removed.  The subumbilical fascial incision was closed  with 3-0 Vicryl suture in locking stitch manner, and the same at the 10-11  port site was subcutaneous closure with 3-0 Vicryl suture as well.  The skin  incision of all three port sites were closed with 4-0 plain cat gut suture.  The 0.25% Marcaine was infiltrated subcutaneously for postoperative  analgesia (for a total of 10 cc), and placed in all three incision sites.  The Hulka tenaculum was removed.  The cervix was inspected.  There was no  bleeding from Hulka tenaculum.  The red rubber Roxan Hockey was inserted once  again, and approximately 75 cc of clear urine was extruded.  The patient was  then extubated and transferred to the recovery room.  She did receive 1 g of  Cefotan prophylactically.  Her vital signs were stable.   ESTIMATED BLOOD LOSS:  Minimal.   FLUID RESUSCITATION:  Consisted of 15/50 cc of lactated Ringer's.                                               Juan H. Lily Peer, M.D.    JHF/MEDQ  D:  06/14/2003  T:  06/14/2003  Job:  [id number]

## 2010-12-19 NOTE — Assessment & Plan Note (Signed)
[facility]                           [place] OFFICE NOTE   NAME:Angie [name], Angie [name]                        MRN:          [id number]  DATE:07/09/2006                            DOB:          1981/04/03    Angie [name] is a 30 year old white female who comes to establish with the  practice in followup of a [facility]ie [name] Emergency Room visit on September 14, 2005 for abdominal pain.  She indicates that basically she had no  improvement since that time. She is using the Vicodin which was  prescribed at [facility] and is basically unchanged.  She has had some nausea  and had had pain in her right lower quadrant for a week prior to going  to the emergency room.   She previously has been seeing Dr. Lily Peer, as a GYN. However, is in  the process of changing physicians.   CURRENT MEDICATIONS:  1. Zyrtec 10 mg one daily.  2. Nasonex nasal spray two sprays daily.  3. Pro air HFA p.r.n.  4. EpiPen p.r.n.   ALLERGIES:  AUGMENTIN causes a rash.   PAST MEDICAL HISTORY:  1. She indicates that she has endometriosis, having had three      surgeries.  The first was a laparoscopy in 2002, done in [place]      with a suprapubic surgical incision.  She has had two laparoscopies      since that time. Most recent in 2004 by Dr. Lily Peer with removal      of her left ovary due to endometriosis.  2. She also has had an episode of anaphylaxis in August of 2007. Went      to an Urgent Care and was treated with epinephrine.  Since that      time, she has been told to carry an EpiPen.  The etiology of this      anaphylaxis has not been found.  She is currently seeing Dr.      Danville Callas, allergist.  3. She also has allergic rhinitis, asthma. However, has had no flares      recently.  4. Situational depression following her miscarriage.  5. Migraines.   PAST SURGICAL HISTORY:  Other than her laparoscopies and laparotomy,  she had tubes bilaterally as a child.  She has been  hospitalized only  for surgery.   SOCIAL HISTORY:  She is married and has no children.  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 She is in no  exercise program. She smokes a pack of cigarettes a day. No alcohol or  street drugs.   REVIEW OF SYSTEMS:  HEENT:  Indicates that she has been diagnosed with  migraines and uses Advil. Has in the past used Imitrex with questionable  response and currently is using Midrin.  She wears glasses for  nearsightedness. Her last eye exam was sometime during the summer at  which time she was negative for glaucoma and cataracts.  She denies any  cardiac or GI or musculoskeletal problems. RESPIRATORY is as discussed  above.  She does indicate that  her asthma began in high school but was  not exercise induced.  GU: She has had no renal stones.  She has had  several urinary tract infections with the most recent in July of 2007.  GYN:  She is a gravida 1, para 0, having had a miscarriage at  approximately four weeks.  She indicated that she did not even know that  she was pregnant.  They are currently using no birth control.  Her last  Pap smear was in July 2007 and she has had no mammogram.  She does have  a  __________ as previously discussed.   FAMILY HISTORY:  Father is living at age 6 with Graves disease,  hypertension and diabetes.  Mother is living at age 9 with colon cancer  and is currently a two-year survivor, having only had chemotherapy.  She  has one brother living and well.  Three sisters living.  One has  polycystic ovaries and thyroid problems.  The other two are without  known medical problems.   With questions regarding her extended family, we find that her paternal  grandmother died of an myocardial infarction.  She also had valvular  disease and had a valve replaced.  Which one is not known.  A maternal  grandmother had hypertension.  All of the men on her father's side of  the family have had diabetes  with the exception of her brother.  To her  knowledge there is no prostate, breast, uterine or colon cancer in the  family other than her mother.  Her paternal grandmother had ovarian  cancer. Paternal grandfather had lung cancer.  There is no history of  drug or alcohol abuse or depression.   IMMUNIZATION RECORDS:  Her last tetanus was in 2000.  She has had the  hepatitis B vaccine but not the pneumonia vaccine.   PHYSICAL EXAMINATION:  VITAL SIGNS:  Blood pressure 97/70, pulse 101,  temperature 98.1, weight 245 pounds, height 5 feet 2 inches.  GENERAL:  Obese, white female in no acute distress.  CHEST:  Clear throughout.  No rales, rhonchi, or wheezes.  CARDIAC:  Heart rate and rhythm regular without murmurs, gallops or  rubs.  No carotid bruits.  No pretibial edema.  ABDOMEN:  Obese with tenderness in her entire right lower quadrant.  She  has rebound only in the right lower quadrant.  There is no rebound or  referred rebound in the remaining three quadrants.  I cannot palpate a  mass. She has a well-healed suprapubic incision.  Cannot appreciate a  lot of scar tissue.  Bowel sounds are heard throughout.  MUSCULOSKELETAL:  Muscle mass is equal, upper and lower extremities.  Gait and station were within normal limits.  SKIN: Without obvious lesions in the exposed areas.  PSYCHIATRIC:  Oriented x3.  Verbalizes easily.  Good historian.   ASSESSMENT:  1. Right lower quadrant pain with etiology unclear.  As she has been      using more ibuprofen than she should, we will try her on tramadol      ER 100 mg.  I have given her a starter pack and asked her cut the      200 mg pills in half.  These should last her from four to eight      days.  We will refer her to a general surgeon for further      evaluation with question of adhesions causing the problems.  2. Obesity.  We had a short  discussion regarding her need to reduce     calories, increase exercise.  Hopefully she will have five  pounds      off in the next month when I intend to see her back.  3. Smoker.  Encouraged her to discontinue this habit as soon as      possible.  4. Other problems as listed on the problem list.      Billie D. Bean, FNP  Electronically Signed      Arta Silence, MD  Electronically Signed   BDB/MedQ  DD: 07/09/2006  DT: 07/10/2006  Job #: [id number]

## 2010-12-25 NOTE — Consult Note (Signed)
Angie [name], Angie [name]                 ACCOUNT NO.:  [id number]  MEDICAL RECORD NO.:  [id number]           PATIENT TYPE:  E  LOCATION:  [facility]                         FACILITY:  [facility]  PHYSICIAN:  Paula Compton, MD        DATE OF BIRTH:  07-15-1981  DATE OF CONSULTATION:  12/17/2010 DATE OF DISCHARGE:  12/17/2010                                CONSULTATION   PRIMARY CARE PHYSICIAN:  [facility], Nilda Simmer, MD  HISTORY OF PRESENT ILLNESS:  The patient is a 30 year old female who presents with the chief complaint of chest pain and feeling overwhelmed. 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  The patient is tearful as she talks about these life stressors.  The patient states that this morning, she had sudden onset of chest pain, chest pain worse with deep breath, chest pain also worse with palpation of chest.  The patient states she also had cold sweats with onset of chest pain.  The chest pain has persisted, varying off and on in intensity throughout the day.  No shortness of breath.  Currently, the patient states she did have shortness of breath when the chest pain came on initially.  She also had palpitations.  REVIEW OF SYSTEMS:  No fever, no nausea, no vomiting, no diarrhea, no diaphoresis, no cough.  Positive for fatigue.  The patient says has a feel of exhausted.  PAST MEDICAL HISTORY: 1. Asthma. 2. Attention deficit disorder. 3. Endometriosis. 4. Migraines. 5. Menorrhagia. 6. Panic attacks.  SURGICAL HISTORY: 1. Hysterectomy 6 days ago. 2. Gallbladder removed. 3. C-section.  SOCIAL HISTORY:  The patient denies drug or alcohol use.  The patient states she smokes  1 pack per day.  The patient's husband has been out of town x9 weeks and will not be returning till July 2012, secondary to being in school in another state.  The patient usually lives herself and her child, but since her operation, she has been living with her mother- in-law.  The patient worked as a Manufacturing systems engineer.  Has a 52-1/2-year-old child.  ALLERGIES: 1. AUGMENTIN. 2. PREDNISONE. 3. DOXYCYCLINE.  HOME MEDICATIONS: 1. Adderall. 2. Alprazolam 0.5 mg t.i.d. p.r.n. for sleep, the patient states she     just takes this at night for sleep.  PHYSICAL EXAMINATION:  VITAL SIGNS:  Temperature 97.3, blood pressure 113/79, pulse rate 95, respiratory rate 18, pulse ox 100% on room air. GENERAL:  No acute distress, resting quietly, tearful. HEENT:  Pupils equal, round, and reactive to light and accommodation. Normocephalic, atraumatic.  Mouth, mucous membranes moist.  No throat erythema. CARDIOVASCULAR:  Regular rate and rhythm.  No murmurs, rubs, or gallops. RESPIRATORY:  Normal breath sounds, clear.  Good air movement.  No wheezing. CHEST:  Positive tenderness to palpation of substernal area and area  beneath the left breast. ABDOMEN:  Soft, nontender, nondistended with lower abdominal hysterectomy scar, staples in place.  No drainage.  No bleeding from the site.  No signs of infection.  No redness at wound edge. EXTREMITIES:  Moving all 4 extremities.  No swelling in lower extremities.  No calf pain. NEUROLOGIC:  Alert and oriented x3. PSYCHIATRIC:  The patient is tearful, expresses feelings of being overwhelmed, appeared anxious, alert and oriented x3.  Denies suicidal ideation or homicidal ideation.  LABS AND STUDIES:  EKG shows normal sinus rhythm, heart rate 85, ST normal, axis normal.  CT angio of chest showed no pulmonary emboli.  No active chest disease evident and resolution of previous abnormality seen in 2006, only mild pulmonary scarring presently.  Point-of-care  markers x2 negative.  Sodium 137, potassium 4.0, creatinine 0.8, glucose 106.  D- dimer 1.32.  White blood cell count 13.9, hemoglobin 15.7.  ASSESSMENT AND PLAN:  The patient is a 30-year female who reports sudden onset of chest pain today and feeling of being overwhelmed.  1. Chest pain.  This is most likely secondary to a panic attack.     Other possible diagnosis would be musculoskeletal pain secondary to     use of chest muscles though she has been trying to protect her     abdominal muscles.  We will treat the patient with her home     medications of alprazolam, she usually uses this just once a day,     but the patient is to use 3 times a day p.r.n. to help with     symptoms.  The patient also to use Vicodin as needed for wound pain     as well as chest pain.  The patient states she is out of     prescription that was prescribed to her at discharge from the     hospital.  The likelihood of this being chest pain from a heart     cause is very unlikely, TIMI score of 1 secondary to cigarette     smoke.  The patient has no significant past medical history of     heart problems.  Family history is negative for early myocardial     infarction or heart disease.  The patient is to follow up with PCP     tomorrow. 2. Anxiety/depression.  The patient has multiple life stressors.  The     patient is tearful describing these to me today during consult.     The patient is to follow up with her PCP to discuss further and     have complete evaluation for anxiety and depression.  Most likely,     the patient will need to be on SSRI.  The patient's PCP will follow     up on this. 3. Status post hysterectomy.  Wound is clean and intact.  The patient     is to take Vicodin as needed for pain.  No concern about this     wound.  She goes tomorrow to have these removed at Dr. Jorene Minors     office. 4. Disposition.  The patient is to call tomorrow morning to her family     doctor's office at  [facility] to get a work-in appointment as soon as possible tomorrow     or the next day.  I will call in the morning to discuss tonight     consult in the emergency department with the office  and to     encourage them to get her work-in appointment as soon as possible.     Ellin Mayhew, MD   ______________________________ Paula Compton, MD    DC/MEDQ  D:  12/17/2010  T:  12/18/2010  Job:  [id number]  Electronically Signed by Ellin Mayhew  on 12/25/2010 08:37:24 AM Electronically Signed by Paula Compton MD on 12/25/2010 03:28:52 PM

## 2011-09-08 ENCOUNTER — Encounter (HOSPITAL_COMMUNITY): Payer: Self-pay | Admitting: Emergency Medicine

## 2011-09-08 ENCOUNTER — Emergency Department (INDEPENDENT_AMBULATORY_CARE_PROVIDER_SITE_OTHER)
Admission: EM | Admit: 2011-09-08 | Discharge: 2011-09-08 | Disposition: A | Discharge status: Home / Self Care | Payer: BC Managed Care – PPO | Source: Home / Self Care | Attending: Emergency Medicine | Admitting: Emergency Medicine

## 2011-09-08 DIAGNOSIS — N390 Urinary tract infection, site not specified: Secondary | ICD-10-CM

## 2011-09-08 LAB — POCT URINALYSIS DIP (DEVICE)
Hgb urine dipstick: NEGATIVE
Protein, ur: NEGATIVE mg/dL
Specific Gravity, Urine: 1.01 (ref 1.005–1.030)
Urobilinogen, UA: 0.2 mg/dL (ref 0.0–1.0)

## 2011-09-08 MED ORDER — ONDANSETRON 8 MG PO TBDP: 8.0000 mg | ORAL_TABLET | Freq: Two times a day (BID) | ORAL | Status: AC | PRN

## 2011-09-08 MED ORDER — CIPROFLOXACIN HCL 500 MG PO TABS: 500.0000 mg | ORAL_TABLET | Freq: Two times a day (BID) | ORAL | Status: AC

## 2011-09-08 MED ORDER — TRAMADOL HCL 50 MG PO TABS: 100.0000 mg | ORAL_TABLET | Freq: Three times a day (TID) | ORAL | Status: AC | PRN

## 2011-09-08 NOTE — ED Notes (Signed)
Not placed in room

## 2011-09-08 NOTE — ED Notes (Signed)
Equipment placed at bedside

## 2011-09-08 NOTE — ED Notes (Signed)
Instructed to place gown on for examination

## 2011-09-08 NOTE — ED Notes (Signed)
Directed patient to bathroom for urine specimen.

## 2011-09-08 NOTE — ED Provider Notes (Signed)
Chief Complaint  Patient presents with  . Urinary Tract Infection    History of Present Illness:  The patient has had a one and a half week history of pain in the CVA area bilaterally. This began in the left and now spread to both sides. Over the past 2 days she's felt nauseated and has vomited about 3 or 4 times. She denies any fever, but has felt chilled. She's had no dysuria, frequency, urgency, or hematuria. She think she may have passed a kidney stone about a week and a half ago. She had some pain in the left, but did not pass anything that was a definite stone. She has a history of a kidney infection 10 years ago which was very severe and necessitated being hospitalized for this. She is status post hysterectomy due to endometriosis, but still has her right ovary. She denies any discharge, itching, pelvic pain, vaginal pain, or anterior abdominal pain.  Review of Systems:  Other than noted above, the patient denies any of the following symptoms: General:  No fevers, chills, sweats, aches, or fatigue. GI:  No abdominal pain, back pain, nausea, vomiting, diarrhea, or constipation. GU:  No dysuria, frequency, urgency, hematuria, incontinence. GYN:  No itching, discharge, pelvic pain, or abnormal menses.  PMFSH:  Past medical history, family history, social history, meds, and allergies were reviewed.  Physical Exam:   Vital signs:  BP 106/70  Pulse 93  Temp(Src) 98.4 F (36.9 C) (Oral)  Resp 16  SpO2 100%  LMP 09/19/2010 Gen:  Alert, oriented, in no distress. Lungs:  Clear to auscultation, no wheezes, rales or rhonchi. Heart:  Regular rhythm, no gallop or murmer. Abdomen:  Flat and soft.  There was slight suprapubic tenderness to palpation without guarding or rebound.  No hepato-splenomegaly or mass.  Bowel sounds were normally active. Back:  She has mild bilateral CVA tenderness to palpation. Skin:  Clear, warm and dry.  Labs:   Results for orders placed during the hospital encounter  of 09/08/11  POCT URINALYSIS DIP (DEVICE)      Component Value Range   Glucose, UA NEGATIVE  NEGATIVE (mg/dL)   Bilirubin Urine NEGATIVE  NEGATIVE    Ketones, ur NEGATIVE  NEGATIVE (mg/dL)   Specific Gravity, Urine 1.010  1.005 - 1.030    Hgb urine dipstick NEGATIVE  NEGATIVE    pH 8.0  5.0 - 8.0    Protein, ur NEGATIVE  NEGATIVE (mg/dL)   Urobilinogen, UA 0.2  0.0 - 1.0 (mg/dL)   Nitrite POSITIVE (*) NEGATIVE    Leukocytes, UA SMALL (*) NEGATIVE   POCT PREGNANCY, URINE      Component Value Range   Preg Test, Ur NEGATIVE  NEGATIVE      Medications given in UCC:  None  Assessment:  Diagnoses that have been ruled out:  None  Diagnoses that are still under consideration:  None  Final diagnoses:  UTI (lower urinary tract infection)     Plan:   1.  The following meds were prescribed:   New Prescriptions   CIPROFLOXACIN (CIPRO) 500 MG TABLET    Take 1 tablet (500 mg total) by mouth 2 (two) times daily.   ONDANSETRON (ZOFRAN ODT) 8 MG DISINTEGRATING TABLET    Take 1 tablet (8 mg total) by mouth every 12 (twelve) hours as needed for nausea.   TRAMADOL (ULTRAM) 50 MG TABLET    Take 2 tablets (100 mg total) by mouth every 8 (eight) hours as needed for pain.  2.  The patient was instructed in symptomatic care and handouts were given. 3.  The patient was told to return if becoming worse in any way, if no better in 3 or 4 days, and given some red flag symptoms that would indicate earlier return.     Roque Lias, MD 09/08/11 [phone]

## 2011-09-08 NOTE — ED Notes (Signed)
Onset one week ago of low back pain.  Patient thinks she passed a kidney stone at that time patient moved back to [place] over the past week.  Back pain seemed to improve, but has worsened now.  Reports nausea and vomiting, no pain with urination.  Denies urgency.  Denies vaginal discharge.  Denies abdominal pain.

## 2011-09-10 ENCOUNTER — Telehealth (HOSPITAL_COMMUNITY): Payer: Self-pay | Admitting: *Deleted

## 2011-09-10 LAB — URINE CULTURE: Colony Count: 70000

## 2011-09-10 NOTE — ED Notes (Addendum)
Urine culture: 70,000 colonies E. Coli.  Pt. treated with Cipro- resistant.  Lab shown to Dr. Chaney Malling and order obtained for Keflex 500 mg. QID x 10 days #40- no refills.  Pt. verified and given results. Pt. told she needs to stop Cipro and take Keflex. Pt. wants Rx. called to [facility] on [address]. Pt. asked for stronger pain medicine. I told to take the Pyridium until the Keflex starts working. States she took it 3 days prior being seen and thought you were not to take it over 3 days. I told her they would not prescribe stronger than Ultram for a UTI.  Rx. called to pharmacist @ [phone]. Vassie Moselle 09/10/2011

## 2012-01-11 ENCOUNTER — Encounter (HOSPITAL_COMMUNITY): Payer: Self-pay | Admitting: Emergency Medicine

## 2012-01-11 ENCOUNTER — Emergency Department (HOSPITAL_COMMUNITY)
Admission: EM | Admit: 2012-01-11 | Discharge: 2012-01-11 | Disposition: A | Discharge status: Home / Self Care | Payer: BC Managed Care – PPO | Attending: Emergency Medicine | Admitting: Emergency Medicine

## 2012-01-11 DIAGNOSIS — J45909 Unspecified asthma, uncomplicated: Secondary | ICD-10-CM | POA: Insufficient documentation

## 2012-01-11 DIAGNOSIS — T7840XA Allergy, unspecified, initial encounter: Secondary | ICD-10-CM | POA: Insufficient documentation

## 2012-01-11 DIAGNOSIS — F17200 Nicotine dependence, unspecified, uncomplicated: Secondary | ICD-10-CM | POA: Insufficient documentation

## 2012-01-11 DIAGNOSIS — I10 Essential (primary) hypertension: Secondary | ICD-10-CM | POA: Insufficient documentation

## 2012-01-11 MED ORDER — RANITIDINE HCL 150 MG/10ML PO SYRP
150.0000 mg | ORAL_SOLUTION | Freq: Once | ORAL | Status: AC
  Administered 2012-01-11: 150 mg via ORAL
  Filled 2012-01-11: qty 10

## 2012-01-11 MED ORDER — KETOROLAC TROMETHAMINE 15 MG/ML IJ SOLN
15.0000 mg | Freq: Once | INTRAMUSCULAR | Status: DC
  Filled 2012-01-11: qty 1

## 2012-01-11 MED ORDER — KETOROLAC TROMETHAMINE 30 MG/ML IJ SOLN
INTRAMUSCULAR | Status: AC
  Administered 2012-01-11: 15 mg
  Filled 2012-01-11: qty 1

## 2012-01-11 MED ORDER — SODIUM CHLORIDE 0.9 % IV BOLUS (SEPSIS)
1000.0000 mL | Freq: Once | INTRAVENOUS | Status: AC
  Administered 2012-01-11: 1000 mL via INTRAVENOUS

## 2012-01-11 MED ORDER — METHYLPREDNISOLONE SODIUM SUCC 125 MG IJ SOLR
125.0000 mg | Freq: Once | INTRAMUSCULAR | Status: AC
  Administered 2012-01-11: 125 mg via INTRAVENOUS
  Filled 2012-01-11: qty 2

## 2012-01-11 NOTE — ED Provider Notes (Signed)
History     CSN: [id number]  Arrival date & time 01/11/12  1518   First MD Initiated Contact with Patient 01/11/12 1535      Chief Complaint  Patient presents with  . Allergic Reaction    (Consider location/radiation/quality/duration/timing/severity/associated sxs/prior treatment) HPI  Patient's a 31 year old female past medical history of asthma presenting today with a one half hour history of some rash on her chest and hives on her chest and abdomen with tingling in mouth and throat and some mild coughing. This feels like anaphylaxis to her.  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  She took 75 mg benadryl 45 minutes ago.  She denies any lightheadedness, vomiting, fever, joint pain, recent colds, or short shortness of breath at this time. She calls illness mild but she is concerned it will get worse. Temperature 98.4, pulse 108, respirations 18, blood pressure 9/82, SpO2 97% on room air.   Past Medical History  Diagnosis Date  . Renal disorder   . Asthma     Past Surgical History  Procedure Date  . Abdominal hysterectomy   . Cholecystectomy   . Cesarean section     History reviewed. No pertinent family history.  History  Substance Use Topics  . Smoking status: Current Everyday Smoker  . Smokeless tobacco: Not on file  . Alcohol Use: No    OB History    Grav Para Term Preterm Abortions TAB SAB Ect Mult Living                  Review of Systems Constitutional: Negative for fever and chills.  HENT: Negative for ear pain, sore throat and trouble swallowing.   Eyes: Negative for pain and visual disturbance.  Respiratory: Negative for cough and shortness of breath.   Cardiovascular: Negative for chest pain and leg swelling.  Gastrointestinal: Negative for nausea, vomiting, abdominal pain and diarrhea.  Genitourinary: Negative for dysuria, urgency and frequency.  Musculoskeletal: Negative for back pain and joint  swelling.  Skin: POS for rash and neg wound.  Neurological: Negative for dizziness, syncope, speech difficulty, weakness and POS mild tingling of extremities.     Allergies  Amoxicillin-pot clavulanate; Cephalosporins; and Penicillins  Home Medications   Current Outpatient Rx  Name Route Sig Dispense Refill  . ALBUTEROL SULFATE HFA 108 (90 BASE) MCG/ACT IN AERS Inhalation Inhale 2 puffs into the lungs every 6 (six) hours as needed. For shortness of breath    . AMPHETAMINE-DEXTROAMPHETAMINE 20 MG PO TABS Oral Take 20 mg by mouth 2 (two) times daily.     . IBUPROFEN 800 MG PO TABS Oral Take 800 mg by mouth every 8 (eight) hours as needed. For pain    . EPINEPHRINE 0.15 MG/0.3ML IJ DEVI Intramuscular Inject 0.15 mg into the muscle as needed.      BP 108/65  Pulse 123  Temp(Src) 98.4 F (36.9 C) (Oral)  Resp 17  SpO2 97%  LMP 09/19/2010  Physical Exam Consitutional: Pt in no acute distress.   OPA: normal, no erythema or swelling.  [place] midline.   Head: Normocephalic and atraumatic.  Eyes: Extraocular motion intact, no scleral icterus Neck: Supple without meningismus, mass, or overt JVD Respiratory: Effort normal and breath sounds normal. No respiratory distress. CV: Heart regular rate and rhythm, no obvious murmurs.  Pulses +2 and symmetric Abdomen: Soft, non-tender, non-distended MSK: Extremities are atraumatic without deformity, ROM intact Skin: Warm, dry, scattered maculae/acne on chest.  Neuro: Alert and oriented, no motor deficit noted.  CNs intact.  PERRL.  Reflexes normal BLE, no clonus.  Hips, knee and ankle, arm and wrist strength maintained.  FTN and RAM normal.  CNs 2-12 normal.  Psychiatric: Mood and affect are normal     ED Course  Procedures (including critical care time)  Labs Reviewed - No data to display No results found.   No diagnosis found.    MDM  Patient looks very well on exam with some mild tachycardia but no other symptoms noted  objectively. Her blood pressure is acceptable. Very faint rash on her anterior chest. Her oropharynx was clear without any swelling, uvula is midline.  Given her history however we will treat her prophylactically with some IV fluids, H2 blockade, as well as some steroids. We will monitor the patient for several hours.  Patient or several hours and feels asymptomatic the home. We discussed the concern for rebound illness, the patient understands this given her history. She has an epinephrine pen with her, and will be accompanied by friends and family the risks creatinine she prefers to go home and will return with earliest symptoms with any difficulty. This seems reasonable to me, especially given the minor symptoms that she presented with essentially an allergic reaction and no signs of anaphylaxis.  PT DC home stable.  Discussed with pt the clinical impression, treatment in the ED, and follow up plan.  We alslo discussed the indications for returning to the ED, which include shortness or breath, confusion, fever, new weakness or numbness, chest pain, or any other concerning symptom.  The pt understood the treatment and plan, is stable, and is able to leave the ED.          Larrie Kass, MD 01/11/12 2029

## 2012-01-11 NOTE — ED Notes (Signed)
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

## 2012-01-11 NOTE — Discharge Instructions (Signed)
Follow up with your providers as dicussed in the ED today and as written above.  See your doctor immediately--or return to the ED--with any new or troubling symptoms including new rash, shortness of breath, trouble breathing, fevers, weakness, new chest pain, shortness or breath, numbness, or any other concerning symptom.    Allergic Reaction, Mild to Moderate  Allergies may happen from anything your body is sensitive to. This may be food, medications, pollens, chemicals, and nearly anything around you in everyday life that produces allergens. An allergen is anything that causes an allergy producing substance. Allergens cause your body to release allergic antibodies. Through a chain of events, they cause a release of histamine into the blood stream. Histamines are meant to protect you, but they also cause your discomfort. This is why antihistamines are often used for allergies. Heredity is often a factor in causing allergic reactions. This means you may have some of the same allergies as your parents. Allergies happen in all age groups. You may have some idea of what caused your reaction. There are many allergens around [place]. It may be difficult to know what caused your reaction. If this is a first time event, it may never happen again. Allergies cannot be cured but can be controlled with medications. SYMPTOMS  You may get some or all of the following problems from allergies.  Swelling and itching in and around the mouth.   Tearing, itchy eyes.   Nasal congestion and runny nose.   Sneezing and coughing.   An itchy red rash or hives.   Vomiting or diarrhea.   Difficulty breathing.  Seasonal allergies occur in all age groups. They are seasonal because they usually occur during the same season every year. They may be a reaction to molds, grass pollens, or tree pollens. Other causes of allergies are house dust mite allergens, pet dander and mold spores. These are just a common few of the thousands of  allergens around [place]. All of the symptoms listed above happen when you come in contact with pollens and other allergens. Seasonal allergies are usually not life threatening. They are generally more of a nuisance that can often be handled using medications. Hay fever is a combination of all or some of the above listed allergy problems. It may often be treated with simple over-the-counter medications such as diphenhydramine. Take medication as directed. Check with your caregiver or package insert for child dosages. TREATMENT AND HOME CARE INSTRUCTIONS If hives or rash are present:  Take medications as directed.   You may use an over-the-counter antihistamine (diphenhydramine) for hives and itching as needed. Do not drive or drink alcohol until medications used to treat the reaction have worn off. Antihistamines tend to make people sleepy.   Apply cold cloths (compresses) to the skin or take baths in cool water. This will help itching. Avoid hot baths or showers. Heat will make a rash and itching worse.   If your allergies persist and become more severe, and over the counter medications are not effective, there are many new medications your caretaker can prescribe. Immunotherapy or desensitizing injections can be used if all else fails. Follow up with your caregiver if problems continue.  SEEK MEDICAL CARE IF:   Your allergies are becoming progressively more troublesome.   You suspect a food allergy. Symptoms generally happen within 30 minutes of eating a food.   Your symptoms have not gone away within 2 days or are getting worse.   You develop new symptoms.  You want to retest yourself or your child with a food or drink you think causes an allergic reaction. Never test yourself or your child of a suspected allergy without being under the watchful eye of your caregivers. A second exposure to an allergen may be life-threatening.  SEEK IMMEDIATE MEDICAL CARE IF:  You develop difficulty breathing  or wheezing, or have a tight feeling in your chest or throat.   You develop a swollen mouth, hives, swelling, or itching all over your body.  A severe reaction with any of the above problems should be considered life-threatening. If you suddenly develop difficulty breathing call for local emergency medical help. THIS IS AN EMERGENCY. MAKE SURE YOU:   Understand these instructions.   Will watch your condition.   Will get help right away if you are not doing well or get worse.  Document Released: 05/17/2007 Document Revised: 07/09/2011 Document Reviewed: 05/17/2007 [facility] Patient Information 2012 [place], [place].

## 2012-01-13 NOTE — ED Provider Notes (Signed)
I saw and evaluated the patient, reviewed the resident's note and I agree with the findings and plan.   .Face to face Exam:  General:  Awake HEENT:  Atraumatic Resp:  Normal effort Abd:  Nondistended Neuro:No focal weakness Lymph: No adenopathy   Nelia Shi, MD 01/13/12 [phone]

## 2012-03-19 ENCOUNTER — Emergency Department (HOSPITAL_COMMUNITY): Payer: BC Managed Care – PPO

## 2012-03-19 ENCOUNTER — Emergency Department (HOSPITAL_COMMUNITY)
Admission: EM | Admit: 2012-03-19 | Discharge: 2012-03-20 | Disposition: A | Discharge status: Home / Self Care | Payer: BC Managed Care – PPO | Attending: Emergency Medicine | Admitting: Emergency Medicine

## 2012-03-19 ENCOUNTER — Encounter (HOSPITAL_COMMUNITY): Payer: Self-pay | Admitting: Emergency Medicine

## 2012-03-19 DIAGNOSIS — J45909 Unspecified asthma, uncomplicated: Secondary | ICD-10-CM | POA: Insufficient documentation

## 2012-03-19 DIAGNOSIS — R1011 Right upper quadrant pain: Secondary | ICD-10-CM | POA: Insufficient documentation

## 2012-03-19 DIAGNOSIS — F17200 Nicotine dependence, unspecified, uncomplicated: Secondary | ICD-10-CM | POA: Insufficient documentation

## 2012-03-19 DIAGNOSIS — R109 Unspecified abdominal pain: Secondary | ICD-10-CM

## 2012-03-19 DIAGNOSIS — Z9089 Acquired absence of other organs: Secondary | ICD-10-CM | POA: Insufficient documentation

## 2012-03-19 DIAGNOSIS — J42 Unspecified chronic bronchitis: Secondary | ICD-10-CM | POA: Insufficient documentation

## 2012-03-19 DIAGNOSIS — R110 Nausea: Secondary | ICD-10-CM | POA: Insufficient documentation

## 2012-03-19 HISTORY — DX: Unspecified chronic bronchitis: J42

## 2012-03-19 LAB — CBC WITH DIFFERENTIAL/PLATELET
Basophils Absolute: 0.1 10*3/uL (ref 0.0–0.1)
Basophils Relative: 1 % (ref 0–1)
MCHC: 35.1 g/dL (ref 30.0–36.0)
Monocytes Absolute: 0.5 10*3/uL (ref 0.1–1.0)
Neutro Abs: 6.1 10*3/uL (ref 1.7–7.7)
Neutrophils Relative %: 63 % (ref 43–77)
RDW: 13 % (ref 11.5–15.5)

## 2012-03-19 LAB — COMPREHENSIVE METABOLIC PANEL
AST: 16 U/L (ref 0–37)
Albumin: 4.4 g/dL (ref 3.5–5.2)
Chloride: 98 mEq/L (ref 96–112)
Creatinine, Ser: 0.76 mg/dL (ref 0.50–1.10)
Potassium: 4 mEq/L (ref 3.5–5.1)
Total Bilirubin: 0.2 mg/dL — ABNORMAL LOW (ref 0.3–1.2)

## 2012-03-19 LAB — URINALYSIS, ROUTINE W REFLEX MICROSCOPIC
Ketones, ur: NEGATIVE mg/dL
Leukocytes, UA: NEGATIVE
Nitrite: NEGATIVE
Protein, ur: NEGATIVE mg/dL
Urobilinogen, UA: 0.2 mg/dL (ref 0.0–1.0)

## 2012-03-19 MED ORDER — OXYCODONE-ACETAMINOPHEN 5-325 MG PO TABS: 1.0000 | ORAL_TABLET | ORAL | Status: DC | PRN

## 2012-03-19 MED ORDER — FENTANYL CITRATE 0.05 MG/ML IJ SOLN
100.0000 ug | Freq: Once | INTRAMUSCULAR | Status: AC
  Administered 2012-03-19: 100 ug via INTRAVENOUS
  Filled 2012-03-19: qty 2

## 2012-03-19 MED ORDER — IOHEXOL 300 MG/ML  SOLN
20.0000 mL | INTRAMUSCULAR | Status: AC
  Administered 2012-03-19: 20 mL via ORAL

## 2012-03-19 MED ORDER — OXYCODONE HCL 5 MG PO TABS
5.0000 mg | ORAL_TABLET | Freq: Once | ORAL | Status: AC
  Administered 2012-03-19: 5 mg via ORAL
  Filled 2012-03-19: qty 1

## 2012-03-19 MED ORDER — ONDANSETRON HCL 4 MG/2ML IJ SOLN
4.0000 mg | Freq: Once | INTRAMUSCULAR | Status: AC
  Administered 2012-03-19: 4 mg via INTRAVENOUS
  Filled 2012-03-19: qty 2

## 2012-03-19 MED ORDER — HYDROMORPHONE HCL PF 1 MG/ML IJ SOLN
1.0000 mg | Freq: Once | INTRAMUSCULAR | Status: AC
  Administered 2012-03-19: 1 mg via INTRAVENOUS
  Filled 2012-03-19: qty 1

## 2012-03-19 MED ORDER — IBUPROFEN 800 MG PO TABS: 800.0000 mg | ORAL_TABLET | Freq: Once | ORAL | Status: DC

## 2012-03-19 MED ORDER — IOHEXOL 300 MG/ML  SOLN
100.0000 mL | Freq: Once | INTRAMUSCULAR | Status: AC | PRN
  Administered 2012-03-19: 100 mL via INTRAVENOUS

## 2012-03-19 MED ORDER — IBUPROFEN 800 MG PO TABS: 800.0000 mg | ORAL_TABLET | Freq: Three times a day (TID) | ORAL | Status: DC | PRN

## 2012-03-19 MED ORDER — PROMETHAZINE HCL 25 MG PO TABS: 25.0000 mg | ORAL_TABLET | Freq: Four times a day (QID) | ORAL | Status: DC | PRN

## 2012-03-19 NOTE — ED Notes (Addendum)
Patient complaining of intermittent abdominal pain that started two days ago; patient has become constant today.  Majority of pain in upper right quadrant; patient reports having gallbladder removed in the past.  Reports nausea; denies vomiting and diarrhea.  Patient also reports intermittent dizziness from pain.  Denies changes in urine and any abnormal vaginal discharge.

## 2012-03-19 NOTE — ED Notes (Signed)
Pt returned from CT scan.

## 2012-03-19 NOTE — ED Notes (Signed)
Pt in route to CT scan.  

## 2012-03-19 NOTE — ED Provider Notes (Signed)
History     CSN: [id number]  Arrival date & time 03/19/12  1931   First MD Initiated Contact with Patient 03/19/12 2000      Chief Complaint  Patient presents with  . Abdominal Pain  . Nausea   HPI:  31 year old woman with a history of cholecystectomy, frequent UTIs, asthma and COPD presenting with RUQ pain.  Pain began yesterday and was initially intermittent.  Today the pain has become constant.  It is an aching pain with occasional (1-2x/hr) sharp pain superimposed.  Pain is 8/10.  Pain does not radiate or move.  Pain is not associated with meals, motion, or breathing.  Ibuprofen was tried without relief.  Patient denies headaches, dyspnea, cough, chest pain, vomiting, diarrhea, constipation, fevers, or chills.  She has had some mild dizziness with the pain and some nausea.  Past Medical History  Diagnosis Date  . Frequent UTI   . Asthma   . Chronic bronchitis     Past Surgical History  Procedure Date  . Total abdominal hysterectomy May 2012    Right ovary still in  . Cholecystectomy June 2011  . Cesarean section   . Salpingectomy November 2004    Left sided, with adhesion lysis    History reviewed. No pertinent family history.  History  Substance Use Topics  . Smoking status: Current Everyday Smoker -- 1.0 packs/day for 15 years    Types: Cigarettes  . Smokeless tobacco: Not on file  . Alcohol Use: No    Review of Systems  All other systems reviewed and are negative.    Allergies  Amoxicillin-pot clavulanate; Cephalosporins; Penicillins; and Sulfa antibiotics  Home Medications   Current Outpatient Rx  Name Route Sig Dispense Refill  . ALBUTEROL SULFATE HFA 108 (90 BASE) MCG/ACT IN AERS Inhalation Inhale 2 puffs into the lungs every 6 (six) hours as needed. For shortness of breath    . AMPHETAMINE-DEXTROAMPHETAMINE 20 MG PO TABS Oral Take 20 mg by mouth 2 (two) times daily.     Marland Kitchen EPINEPHRINE 0.15 MG/0.3ML IJ DEVI Intramuscular Inject 0.15 mg into the muscle  as needed.    . IBUPROFEN 800 MG PO TABS Oral Take 800 mg by mouth every 8 (eight) hours as needed. For pain      BP 119/98  Pulse 111  Temp 97.8 F (36.6 C) (Oral)  Resp 18  SpO2 96%  LMP 09/19/2010  Physical Exam  Constitutional: She appears well-developed and well-nourished.  Non-toxic appearance. She appears distressed.  Cardiovascular: Normal rate, regular rhythm and normal heart sounds.   No murmur heard. Pulmonary/Chest: Effort normal and breath sounds normal.  Abdominal: Soft. Bowel sounds are normal. She exhibits no mass. There is tenderness in the right upper quadrant. There is no rigidity, no rebound and no guarding.  Skin: Skin is warm, dry and intact.    ED Course  Procedures (including critical care time)  8:31 PM - 5mg  oxycodone ordered for pain.  9:20 PM - Pt reassessed.  Pain worsening.  IV started.  Lactate drawn.  Fentanyl ordered.  CT abd/pelvis ordered.  10:13 PM - Pt reassessed.  Pain unchanged.  IV Fentanyl took the edge off but only momentarily.  IV Dilaudid ordered.  Labs Reviewed  COMPREHENSIVE METABOLIC PANEL - Abnormal; Notable for the following:    Glucose, Bld 108 (*)     Total Bilirubin 0.2 (*)     All other components within normal limits  URINALYSIS, ROUTINE W REFLEX MICROSCOPIC  CBC WITH  DIFFERENTIAL  LIPASE, BLOOD  LACTIC ACID, PLASMA   Ct Abdomen Pelvis W Contrast  03/19/2012  *RADIOLOGY REPORT*  Clinical Data: Abdominal pain.  Right upper quadrant pain.  CT ABDOMEN AND PELVIS WITH CONTRAST  Technique:  Multidetector CT imaging of the abdomen and pelvis was performed following the standard protocol during bolus administration of intravenous contrast.  Contrast: OMNIPAQUE IOHEXOL 300 MG/ML  SOLN  Comparison: CT 07/04/2006  Findings: Lung bases are clear.  No effusions.  Heart is normal size.  Prior cholecystectomy.  Stomach, liver, spleen, pancreas, adrenals and kidneys are unremarkable.  There is a normal retrocecal appendix. Bowel  grossly unremarkable. No free fluid, free air, or adenopathy.  Prior hysterectomy.  Urinary bladder and adnexa unremarkable.  No acute bony abnormality.  IMPRESSION: No acute findings in the abdomen or pelvis.  Prior cholecystectomy.  Original Report Authenticated By: Cyndie Chime, M.D.     1. Abdominal pain       MDM  1.   Abdominal pain:  Endometritis is a possibility; although she had a TAH last year, she still has a functioning ovary and she has a history of endometritis. With a history of multiple abdominal surgeries, adhesion pain is another possibility.  Bowel obstruction from incarceration in adhesions is ruled out by the normal CT.    Appendicitis is similarly ruled out.  Laboratory testing makes liver, kidney, and pancrease pathology unlikely.  Patient is instructed to call her PCP and gynecologist Monday morning.  Sent home with ibuprofen 800mg , Percocet, and promethazine.  Lollie Sails, MD 03/19/12 [phone]

## 2012-03-20 NOTE — ED Provider Notes (Signed)
I saw and evaluated the patient, reviewed the resident's note and I agree with the findings and plan.   .Face to face Exam:  General:  Awake HEENT:  Atraumatic Resp:  Normal effort Abd:  Nondistended Neuro:No focal weakness Lymph: No adenopathy   Nelia Shi, MD 03/20/12 0501

## 2012-03-23 ENCOUNTER — Emergency Department (HOSPITAL_COMMUNITY)
Admission: EM | Admit: 2012-03-23 | Discharge: 2012-03-23 | Disposition: A | Discharge status: Home / Self Care | Payer: BC Managed Care – PPO | Attending: Emergency Medicine | Admitting: Emergency Medicine

## 2012-03-23 ENCOUNTER — Encounter (HOSPITAL_COMMUNITY): Payer: Self-pay | Admitting: Emergency Medicine

## 2012-03-23 DIAGNOSIS — Z87440 Personal history of urinary (tract) infections: Secondary | ICD-10-CM | POA: Insufficient documentation

## 2012-03-23 DIAGNOSIS — N809 Endometriosis, unspecified: Secondary | ICD-10-CM | POA: Insufficient documentation

## 2012-03-23 DIAGNOSIS — Z882 Allergy status to sulfonamides status: Secondary | ICD-10-CM | POA: Insufficient documentation

## 2012-03-23 DIAGNOSIS — J45909 Unspecified asthma, uncomplicated: Secondary | ICD-10-CM | POA: Insufficient documentation

## 2012-03-23 DIAGNOSIS — Z8742 Personal history of other diseases of the female genital tract: Secondary | ICD-10-CM | POA: Insufficient documentation

## 2012-03-23 DIAGNOSIS — Z90710 Acquired absence of both cervix and uterus: Secondary | ICD-10-CM | POA: Insufficient documentation

## 2012-03-23 DIAGNOSIS — F17200 Nicotine dependence, unspecified, uncomplicated: Secondary | ICD-10-CM | POA: Insufficient documentation

## 2012-03-23 DIAGNOSIS — Z9089 Acquired absence of other organs: Secondary | ICD-10-CM | POA: Insufficient documentation

## 2012-03-23 DIAGNOSIS — R1011 Right upper quadrant pain: Secondary | ICD-10-CM | POA: Insufficient documentation

## 2012-03-23 DIAGNOSIS — Z880 Allergy status to penicillin: Secondary | ICD-10-CM | POA: Insufficient documentation

## 2012-03-23 DIAGNOSIS — R109 Unspecified abdominal pain: Secondary | ICD-10-CM

## 2012-03-23 HISTORY — DX: Endometriosis, unspecified: N80.9

## 2012-03-23 LAB — BASIC METABOLIC PANEL
BUN: 6 mg/dL (ref 6–23)
GFR calc non Af Amer: >90 mL/min (ref >90)
Glucose, Bld: 90 mg/dL (ref 70–99)
Potassium: 3.9 mEq/L (ref 3.5–5.1)

## 2012-03-23 LAB — CBC WITH DIFFERENTIAL/PLATELET
Eosinophils Absolute: 0.6 10*3/uL (ref 0.0–0.7)
Hemoglobin: 13.6 g/dL (ref 12.0–15.0)
Lymphs Abs: 2.3 10*3/uL (ref 0.7–4.0)
MCH: 31.1 pg (ref 26.0–34.0)
MCV: 89.7 fL (ref 78.0–100.0)
Monocytes Relative: 6 % (ref 3–12)
Neutrophils Relative %: 65 % (ref 43–77)
RBC: 4.38 MIL/uL (ref 3.87–5.11)

## 2012-03-23 LAB — URINALYSIS, ROUTINE W REFLEX MICROSCOPIC
Bilirubin Urine: NEGATIVE
Glucose, UA: NEGATIVE mg/dL
Hgb urine dipstick: NEGATIVE
Specific Gravity, Urine: 1.019 (ref 1.005–1.030)
Urobilinogen, UA: 0.2 mg/dL (ref 0.0–1.0)
pH: 6 (ref 5.0–8.0)

## 2012-03-23 MED ORDER — OXYCODONE-ACETAMINOPHEN 5-325 MG PO TABS
1.0000 | ORAL_TABLET | Freq: Once | ORAL | Status: AC
  Administered 2012-03-23: 1 via ORAL
  Filled 2012-03-23: qty 1

## 2012-03-23 MED ORDER — OXYCODONE-ACETAMINOPHEN 5-325 MG PO TABS: 1.0000 | ORAL_TABLET | Freq: Four times a day (QID) | ORAL | Status: AC | PRN

## 2012-03-23 MED ORDER — OMEPRAZOLE 40 MG PO CPDR: 40.0000 mg | DELAYED_RELEASE_CAPSULE | Freq: Every day | ORAL | Status: DC

## 2012-03-23 NOTE — ED Provider Notes (Signed)
History     CSN: [id number]  Arrival date & time 03/23/12  1058   First MD Initiated Contact with Patient 03/23/12 1719      Chief Complaint  Patient presents with  . Abdominal Pain    (Consider location/radiation/quality/duration/timing/severity/associated sxs/prior treatment) Patient is a 31 y.o. female presenting with abdominal pain. The history is provided by the patient.  Abdominal Pain The primary symptoms of the illness include abdominal pain and nausea. The primary symptoms of the illness do not include fever, shortness of breath, vomiting, diarrhea, hematemesis, hematochezia, dysuria, vaginal discharge or vaginal bleeding. The current episode started more than 2 days ago (1 week). The onset of the illness was gradual. The problem has not changed since onset. The abdominal pain began more than 2 days ago (1 week). The pain came on gradually. The abdominal pain has been unchanged since its onset. The abdominal pain is located in the RUQ. The abdominal pain does not radiate. The abdominal pain is relieved by nothing. The abdominal pain is exacerbated by fatty foods and eating.  The patient states that she believes she is currently not pregnant. The patient has not had a change in bowel habit. Additional symptoms associated with the illness include heartburn. Symptoms associated with the illness do not include chills, constipation, urgency, hematuria or frequency.    Past Medical History  Diagnosis Date  . Frequent UTI   . Asthma   . Chronic bronchitis   . Endometriosis     Past Surgical History  Procedure Date  . Total abdominal hysterectomy May 2012    Right ovary still in  . Cholecystectomy June 2011  . Cesarean section   . Salpingectomy November 2004    Left sided, with adhesion lysis    History reviewed. No pertinent family history.  History  Substance Use Topics  . Smoking status: Current Everyday Smoker -- 1.0 packs/day for 15 years    Types: Cigarettes  .  Smokeless tobacco: Not on file  . Alcohol Use: No    OB History    Grav Para Term Preterm Abortions TAB SAB Ect Mult Living                  Review of Systems  Constitutional: Negative for fever and chills.  HENT: Negative.   Eyes: Negative.   Respiratory: Negative.  Negative for cough and shortness of breath.   Cardiovascular: Negative for chest pain and palpitations.  Gastrointestinal: Positive for heartburn, nausea and abdominal pain. Negative for vomiting, diarrhea, constipation, blood in stool, hematochezia and hematemesis.  Genitourinary: Negative for dysuria, urgency, frequency, hematuria, vaginal bleeding, vaginal discharge, difficulty urinating and pelvic pain.  Musculoskeletal: Negative.   Skin: Negative.   Neurological: Negative.   All other systems reviewed and are negative.    Allergies  Amoxicillin-pot clavulanate; Penicillins; Sulfa antibiotics; and Cephalosporins  Home Medications   Current Outpatient Rx  Name Route Sig Dispense Refill  . ALBUTEROL SULFATE HFA 108 (90 BASE) MCG/ACT IN AERS Inhalation Inhale 2 puffs into the lungs every 6 (six) hours as needed. For shortness of breath    . FLUTICASONE-SALMETEROL 250-50 MCG/DOSE IN AEPB Inhalation Inhale 1 puff into the lungs every 12 (twelve) hours.    . IBUPROFEN 800 MG PO TABS Oral Take 800 mg by mouth every 8 (eight) hours as needed. For pain    . MONTELUKAST SODIUM 10 MG PO TABS Oral Take 10 mg by mouth at bedtime.    Marland Kitchen PHENTERMINE HCL 37.5 MG PO CAPS  Oral Take 37.5 mg by mouth every morning.    Marland Kitchen PROMETHAZINE HCL 25 MG PO TABS Oral Take 1 tablet (25 mg total) by mouth every 6 (six) hours as needed for nausea. 12 tablet 0  . EPINEPHRINE 0.15 MG/0.3ML IJ DEVI Intramuscular Inject 0.15 mg into the muscle as needed.    Marland Kitchen OMEPRAZOLE 40 MG PO CPDR Oral Take 1 capsule (40 mg total) by mouth daily. 30 capsule 0  . OXYCODONE-ACETAMINOPHEN 5-325 MG PO TABS Oral Take 1 tablet by mouth every 6 (six) hours as needed for  pain. 30 tablet 0    BP 111/65  Pulse 93  Temp 98 F (36.7 C) (Oral)  Resp 20  SpO2 100%  LMP 09/19/2010  Physical Exam  Nursing note and vitals reviewed. Constitutional: She is oriented to person, place, and time. She appears well-developed and well-nourished. No distress.  HENT:  Head: Normocephalic and atraumatic.  Eyes: Conjunctivae are normal.  Neck: Neck supple.  Cardiovascular: Normal rate, regular rhythm, normal heart sounds and intact distal pulses.   Pulmonary/Chest: Effort normal and breath sounds normal. She has no wheezes. She has no rales.  Abdominal: Soft. She exhibits no distension. There is tenderness in the right upper quadrant and epigastric area. There is no rigidity and no guarding.  Musculoskeletal: Normal range of motion.  Neurological: She is alert and oriented to person, place, and time.  Skin: Skin is warm and dry.    ED Course  Procedures (including critical care time)  Labs Reviewed  CBC WITH DIFFERENTIAL - Abnormal; Notable for the following:    Eosinophils Relative 6 (*)     All other components within normal limits  URINALYSIS, ROUTINE W REFLEX MICROSCOPIC - Abnormal; Notable for the following:    APPearance HAZY (*)     All other components within normal limits  BASIC METABOLIC PANEL  PREGNANCY, URINE   No results found.   1. Abdominal pain       MDM  31 yo female with history of cholecystectomy, hysterectomy with right ovary left in place who presents for one week of worsening RUQ pain.  Pain is constant ache with sharp stabbing pains after eating.  Associated sx of nausea.  No history of fever, vomiting, constipation, diarrhea, or urinary sx.  Pt was seen in ED 4 days ago and had work-up including nml LFT's, lipase, and nml CT abdomen/pelvis with no findings to explain pt's pain.  She was given Rx for phenergan, Percocet, and ibuprofen.  Phenergan has improved the nausea and pain was improved by Percocet, but pt is now out of Percocet.   Pt attempting to get GI follow-up.  AF, VSS, NAD.  Physical exam with TTP epigastric and RUQ w/o guarding.  No signs or symptoms of SBO, no lower abdominal pain or signs of appendicitis.  Labs obtained in triage all wnl.  Given recent work-up, no worsening sx, and nml labs today, recommend GI follow-up for further evaluation.  Will provide referral to GI for further evaluation and Rx for Percocet for pain.  Tx plan discussed with pt who will follow-up.  Return precautions provided.        Cherre Robins, MD 03/24/12 [phone]

## 2012-03-23 NOTE — ED Provider Notes (Signed)
I saw and evaluated the patient, reviewed the resident's note and I agree with the findings and plan. She has hx of cholecystectomy. Hysterectomy and endometriosis.  She has had ruq pain for several days. No resp sxs or gu sxs.  Seen in ed a few days ago for same.  Had ct and labs.  Referred to gi. She ran out of meds. sxs persist.  So came to ed.   On exam tearful.  No acute abdomen.  Labs unremarkable.  Will give more analgesics and send to gi.    Cheri Guppy, MD 03/23/12 1758

## 2012-03-23 NOTE — ED Notes (Signed)
Pt c/o upper abd pain with nausea and dizziness x 1 week; pt seen here for same on Saturday

## 2012-03-23 NOTE — ED Notes (Signed)
Pt reports upper abd pain and nausea x1 week, pt seen here Saturday for the same. Pt describes her pain as constant sharp spasms. Pt denies V/D, burning w/urination, abnormal vaginal odor, d/c, or bleeding. Pt reports she called her gynecologist and they instructed pt to f/u w/a GI specialist. Pt reports she is unable to get an appt. W/a GI dr. Rock Nephew reports she is out of her pain medication from Saturdays visit. Pt reports eating increase the pain. Pt has a hx of endometriosis

## 2012-03-23 NOTE — ED Notes (Signed)
MD at bedside. Dr. Beaver 

## 2012-03-24 NOTE — ED Provider Notes (Signed)
I saw and evaluated the patient, reviewed the resident's note and I agree with the findings and plan.  Marytza Grandpre, MD 03/24/12 1602 

## 2012-04-02 ENCOUNTER — Telehealth: Payer: Self-pay | Admitting: Internal Medicine

## 2012-04-02 NOTE — Telephone Encounter (Signed)
Several weeks of left side abdominal pain - negative CT abd/pelvis and labs 8/21 ED visit.  Now with pain on Right.  Worse when bends over.  Some constipation - mild. Using Percocet at night.  No known fever.  Advised:  800 mg Ibuprofen - is on PPI already Laxatives - several doses of MiraLax   Has pending EGD and colonoscopy with Dr. Loreta Ave

## 2012-07-04 IMAGING — CT CT ANGIO CHEST
1 of 3 series · 19 of 32 positions shown · IV contrast (APPLIED)
Comparison: Chest radiography [DATE].  Previous CT angiography
09/09/2004.

CLINICAL DATA: Chest pain.  Short of breath.

CT ANGIOGRAPHY CHEST WITH CONTRAST
TECHNIQUE: Multidetector CT imaging of the chest was performed
using the standard protocol during bolus administration of
intravenous contrast.  Multiplanar CT image reconstructions
including MIPs were obtained to evaluate the vascular anatomy.
Contrast:  80 ml [id number]

[Series 9: pulm embolism 1.0 b25f thins · axial · 0.69mm/px · z∈[+1142,+1402]mm · 19 of 290 slices shown]
[im 15/290  lung]
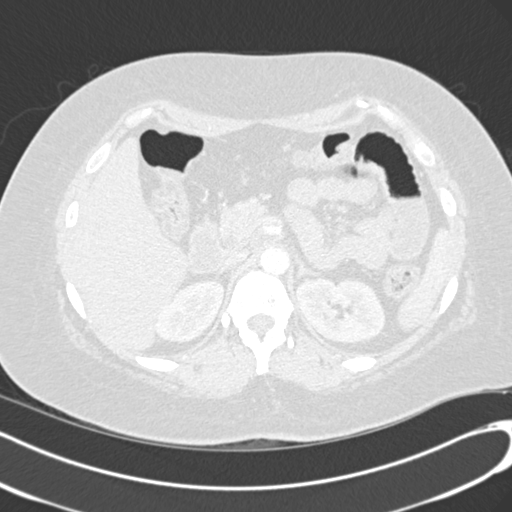
[im 29/290  mediastinal]
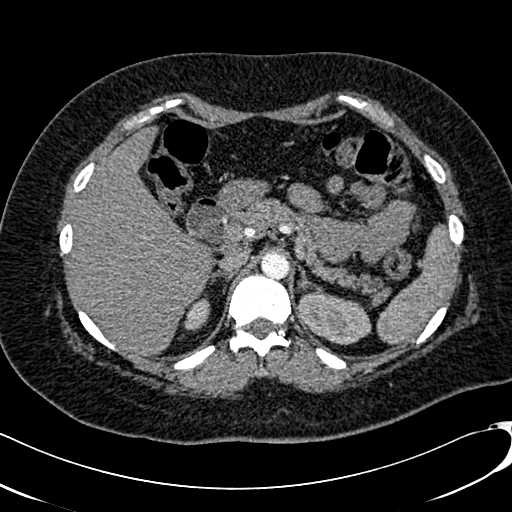
[im 44/290  lung]
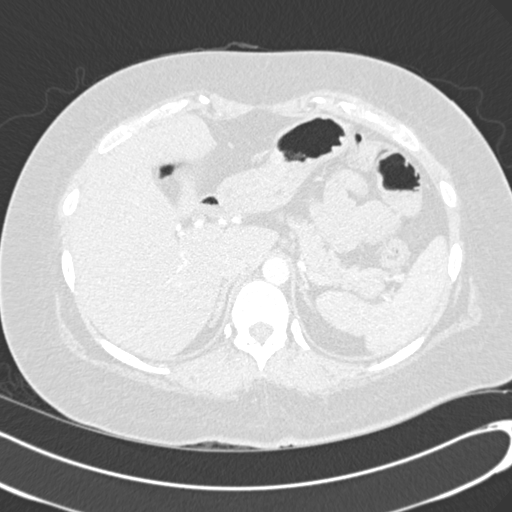
[im 73/290  mediastinal]
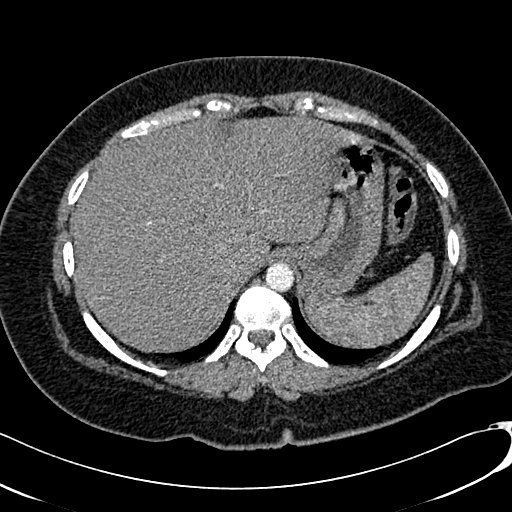
[im 87/290  lung]
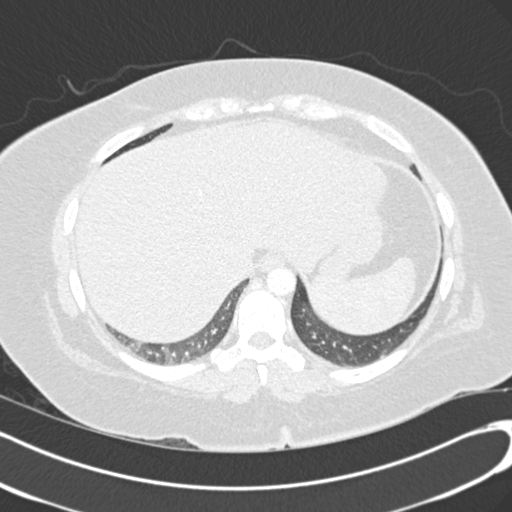
[im 97/290  mediastinal]
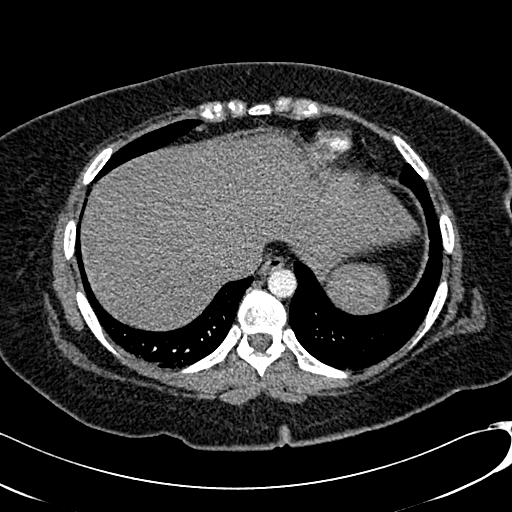
[im 102/290  lung]
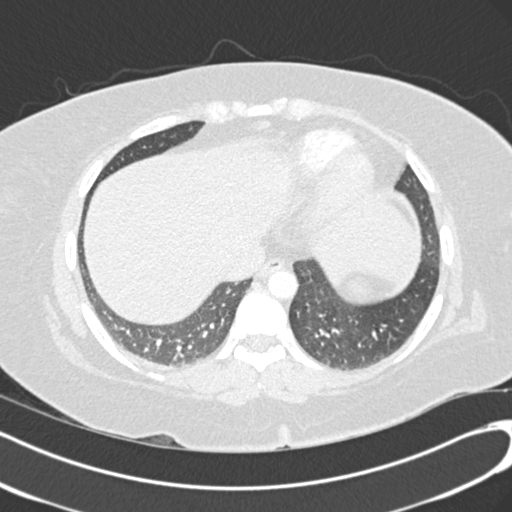
[im 116/290  mediastinal]
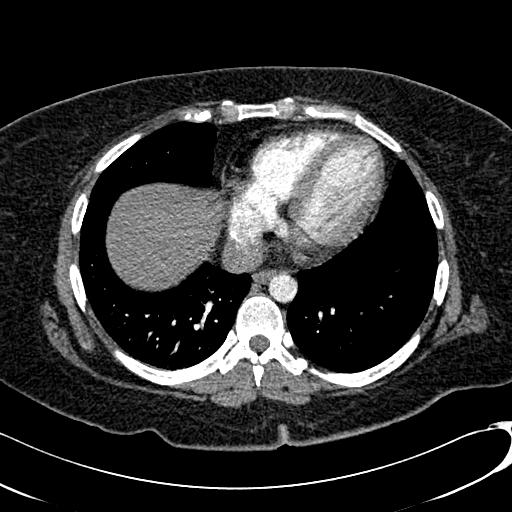
[im 131/290  lung]
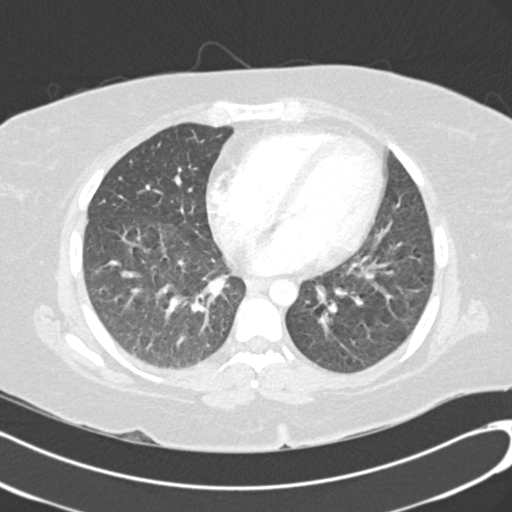
[im 145/290  mediastinal]
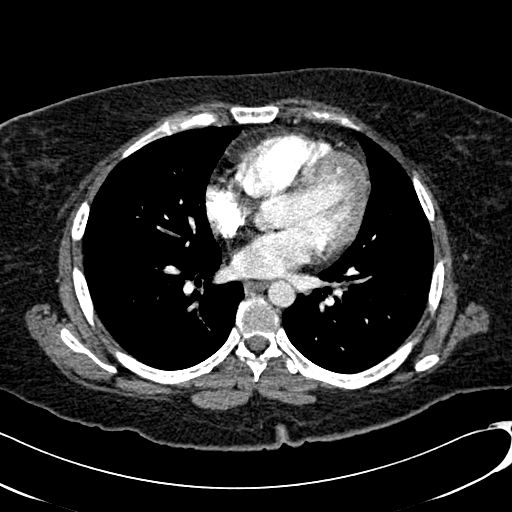
[im 159/290  lung]
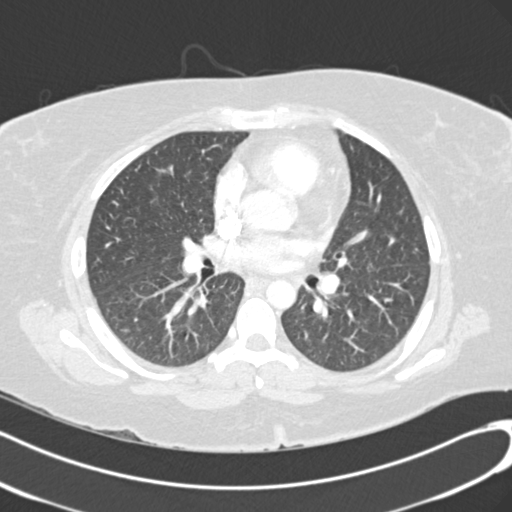
[im 174/290  mediastinal]
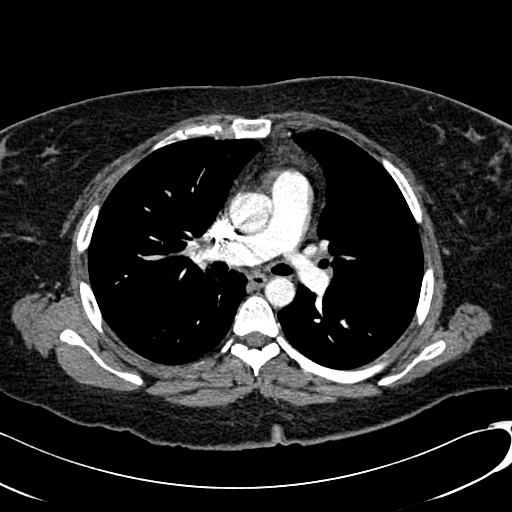
[im 188/290  lung]
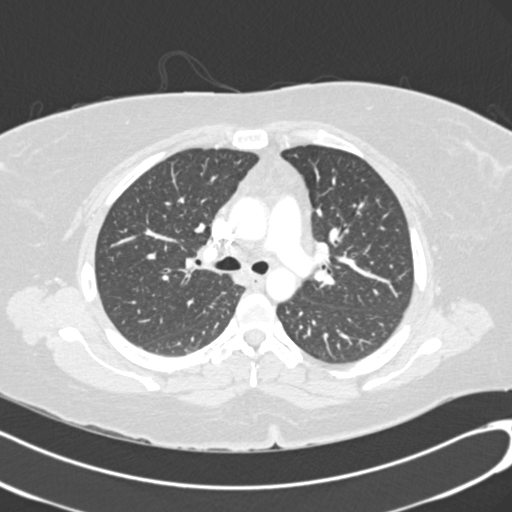
[im 193/290  mediastinal]
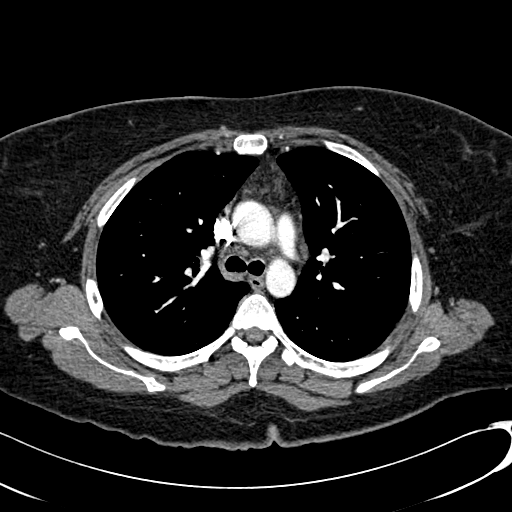
[im 203/290  lung]
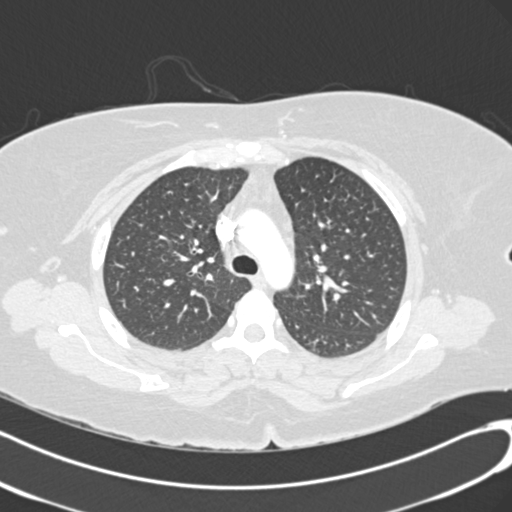
[im 217/290  mediastinal]
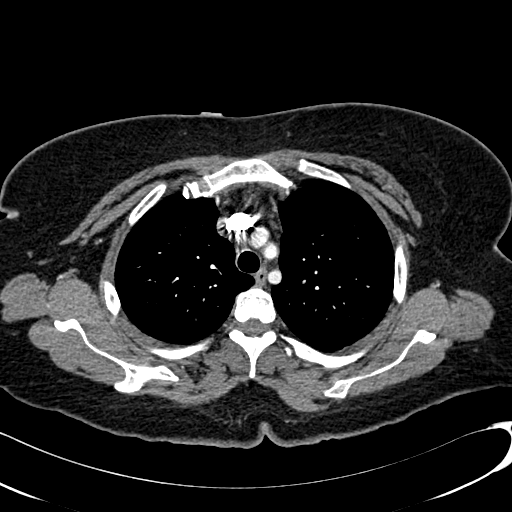
[im 246/290  lung]
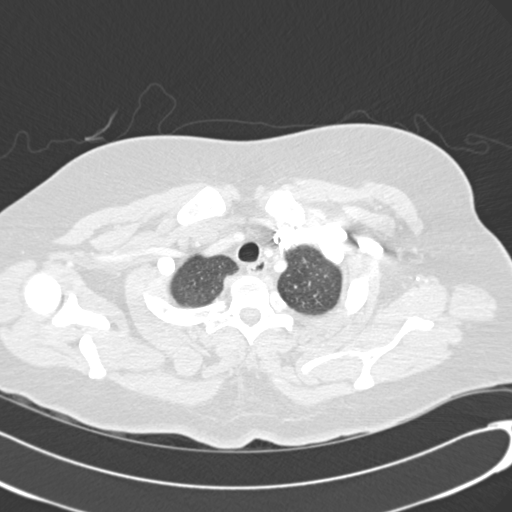
[im 261/290  mediastinal]
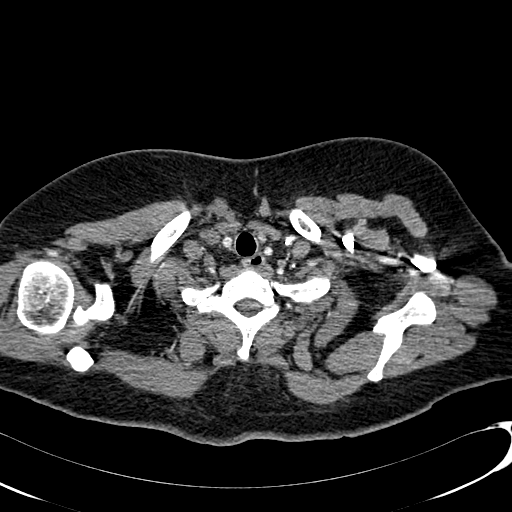
[im 275/290  lung]
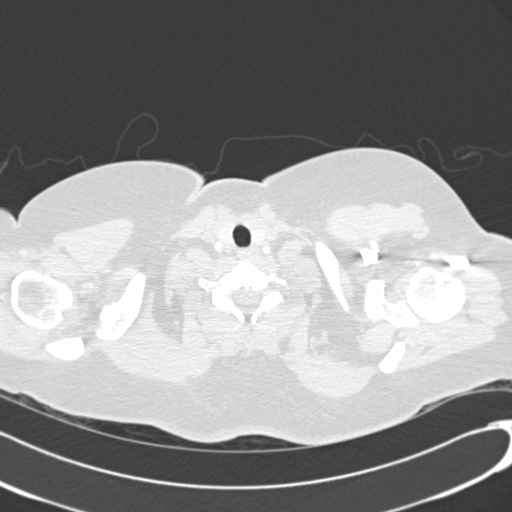

[19 of 32 positions shown; findings below may reference images not displayed]

FINDINGS: Pulmonary arterial opacification is good.  There are no
pulmonary emboli.  There are no enlarged hilar or mediastinal lymph
nodes seen on today's examination.  There is no pleural or
pericardial fluid.  There is mild perihilar and upper lobe
pulmonary scarring, probably related to the pathology demonstrated
in 8661.  Scans in the upper abdomen show previous cholecystectomy
but no acute or significant finding.

Review of the MIP images confirms the above findings.
IMPRESSION: No pulmonary emboli.  No active chest disease evident.  Resolution
of previous abnormalities seen in 8661, with only mild pulmonary
scarring presently.

## 2012-07-08 ENCOUNTER — Encounter: Payer: Self-pay | Admitting: Internal Medicine

## 2012-07-08 ENCOUNTER — Encounter: Payer: Self-pay | Admitting: *Deleted

## 2012-07-08 ENCOUNTER — Ambulatory Visit (INDEPENDENT_AMBULATORY_CARE_PROVIDER_SITE_OTHER): Payer: BC Managed Care – PPO | Admitting: Internal Medicine

## 2012-07-08 ENCOUNTER — Institutional Professional Consult (permissible substitution): Payer: BC Managed Care – PPO | Admitting: Internal Medicine

## 2012-07-08 VITALS — BP 82/60 | HR 100 | Resp 18 | Ht 60.0 in | Wt 235.0 lb

## 2012-07-08 DIAGNOSIS — G4733 Obstructive sleep apnea (adult) (pediatric): Secondary | ICD-10-CM

## 2012-07-08 DIAGNOSIS — Z6841 Body Mass Index (BMI) 40.0 and over, adult: Secondary | ICD-10-CM | POA: Insufficient documentation

## 2012-07-08 DIAGNOSIS — F419 Anxiety disorder, unspecified: Secondary | ICD-10-CM | POA: Insufficient documentation

## 2012-07-08 DIAGNOSIS — F411 Generalized anxiety disorder: Secondary | ICD-10-CM

## 2012-07-08 DIAGNOSIS — R42 Dizziness and giddiness: Secondary | ICD-10-CM

## 2012-07-08 NOTE — Assessment & Plan Note (Signed)
She has significant snoring and worsening fatigue in the context of a 50 pound weight. I will ask Dr. Paulino Rily to undertake a sleep study

## 2012-07-08 NOTE — Assessment & Plan Note (Signed)
The patient has dizziness accompanied by nausea occurring in the upright position and relieved by becoming supine. Objective measurements today demonstrated a modest fall in blood pressure; unfortunately couldn't measurements could not be obtained despite multiple efforts for reasons not clear.  I suspect that she does have symptoms of dysautonomia and she has had problems with orthostatic intolerance going back a number of years. Potentially could have been aggravated by pneumonia/viral illness earlier this summer.  Stress anxiety and depression can also aggravate these disorders and attention would need to be paid to the potential for contributing to her symptoms worsening over recent months.  We only see discussion regarding the physiology of orthostatic intolerance and we have discussed proceeding with tilt table testing to try to clarify objectively her orthostatic resilience. I've encouraged her to increase her salt and water intake. I've also suggested that it important to lose weight. I will ask her PCP to pursue sleep studies.  I have reviewed further also value of isometric exercise with standing

## 2012-07-08 NOTE — Patient Instructions (Signed)
Your physician has recommended that you have a tilt table test. This test is sometimes used to help determine the cause of fainting spells. You lie on a table that moves from a lying down to an upright position. The change in position can bring on loss of consciousness. The doctor monitors your symptoms, heart rate, EKG, and blood pressure throughout the test. The doctor also may give you a medicine and then monitor your response to the medicine. This is done in the hospital and usually takes half of a day to complete the procedure. Please see the instruction sheet given to you today for more information.   

## 2012-07-08 NOTE — Progress Notes (Signed)
ELECTROPHYSIOLOGY CONSULT NOTE  Patient ID: Angie [name], MRN: [id number], DOB/AGE: 09/25/1980 31 y.o. Admit date: (Not on file) Date of Consult: 07/08/2012  Primary Physician: Nilda Simmer, MD Primary Cardiologist: SW  Chief Complaint: DIZZINESS    HPI Angie [name] is a 31 y.o. female  Referred for possible dysautonomia.  She is a morbidly obese woman who has lost 50 pounds in and we gained 50 pounds over the last year and a half who in June of this year developed pneumonia. On initial questioning, she denies prior history of dizziness and presyncope but retrospectively has had symptoms that antedates this episode of pneumonia.  In August she began developing right upper quadrant pain in the setting of prior cholecystectomy and underwent an evaluation by Dr. Loreta Ave for which no specific cause was identified.\  In October she began developing problems with lightheadedness accompanied by nausea. These episodes were mostly associated with prolonged standing and then abrupt changes in position. They were occasionally associated with standing and rarely with sitting he did not happen while lying down. The duration of these episodes with a 5-15 minutes and she was described as being pale with these episodes. There is also mild diaphoresis. By lying down the lightheadedness would abate as was the nausea. If  She is noted worsening of symptoms with prolonged standing symptoms are better in the colder weather.   She does tolerate showers. She takes them hot but brief    She notes that prior to her hysterectomy a year and a half ago undertaking for endometriosis that her symptoms tended to be worse around the time of her periods which she described as modestly heavy.  Her diet is salt deplete is also fluid of late. She is troubled with depression although not sure she understands what is reactive or not.  She also snores and has significant fatigue and it is this fatigue which is limiting her now  in many ways and is interfering with her work. She has put on 50 pounds or so in the last year.    Past Medical History  Diagnosis Date  . Frequent UTI   . Asthma   . Chronic bronchitis   . Endometriosis       Surgical History:  Past Surgical History  Procedure Date  . Total abdominal hysterectomy May 2012    Right ovary still in  . Cholecystectomy June 2011  . Cesarean section   . Salpingectomy November 2004    Left sided, with adhesion lysis     Home Meds: Prior to Admission medications   Medication Sig Start Date End Date Taking? Authorizing Provider  albuterol (PROVENTIL HFA;VENTOLIN HFA) 108 (90 BASE) MCG/ACT inhaler Inhale 2 puffs into the lungs every 6 (six) hours as needed. For shortness of breath   Yes Historical Provider, MD  ALPRAZolam (XANAX) 0.25 MG tablet Take 0.25 mg by mouth at bedtime as needed.   Yes Historical Provider, MD  EPINEPHrine (EPIPEN JR) 0.15 MG/0.3ML injection Inject 0.15 mg into the muscle as needed.   Yes Historical Provider, MD  Fluticasone-Salmeterol (ADVAIR) 250-50 MCG/DOSE AEPB Inhale 1 puff into the lungs every 12 (twelve) hours.   Yes Historical Provider, MD  ibuprofen (ADVIL,MOTRIN) 800 MG tablet Take 800 mg by mouth every 8 (eight) hours as needed. For pain   Yes Historical Provider, MD  ondansetron (ZOFRAN) 8 MG tablet Take 8 mg by mouth every 8 (eight) hours as needed.   Yes Historical Provider, MD  montelukast (SINGULAIR) 10 MG tablet  Take 10 mg by mouth at bedtime.    Historical Provider, MD  omeprazole (PRILOSEC) 40 MG capsule Take 1 capsule (40 mg total) by mouth daily. 03/23/12 03/23/13  Cherre Robins, MD  phentermine 37.5 MG capsule Take 37.5 mg by mouth every morning.    Historical Provider, MD  promethazine (PHENERGAN) 25 MG tablet Take 1 tablet (25 mg total) by mouth every 6 (six) hours as needed for nausea. 03/19/12 03/26/12  Lollie Sails, MD     Allergies:  Allergies  Allergen Reactions  . Amoxicillin-Pot Clavulanate  Anaphylaxis  . Penicillins Anaphylaxis and Hives  . Sulfa Antibiotics Anaphylaxis  . Cephalosporins Hives    History   Social History  . Marital Status: Legally Separated    Spouse Name: N/A    Number of Children: N/A  . Years of Education: N/A   Occupational History  . Not on file.   Social History Main Topics  . Smoking status: Current Every Day Smoker -- 1.0 packs/day for 15 years    Types: Cigarettes  . Smokeless tobacco: Not on file  . Alcohol Use: No  . Drug Use: No  . Sexually Active: Yes   Other Topics Concern  . Not on file   Social History Narrative  . No narrative on file     she works as a Associate Professor. She has one son she lives with her boyfriend she smokes  No family history on file.   ROS:  Please see the history of present illness.     All other systems reviewed and negative.    Physical Exam:  Blood pressure 82/60, pulse 100, resp. rate 18, height 5' (1.524 m), weight 235 lb (106.595 kg), last menstrual period 09/19/2010, SpO2 92.00%. General: Well developed, well nourished morbidly obese Caucasian age appearing   female in no acute distress. Head: Normocephalic, atraumatic, sclera non-icteric, no xanthomas, nares are without discharge. Poor dentition  Lymph Nodes:  none Back: without scoliosis/kyphosis , no CVA tendersness Neck: Negative for carotid bruits. JVD not elevated. Lungs: Clear bilaterally to auscultation without wheezes, rales, or rhonchi. Breathing is unlabored. Heart: RRR with S1 S2. No  murmur , rubs, or gallops appreciated. Abdomen: Soft, non-tender, non-distended with normoactive bowel sounds. No hepatomegaly. No rebound/guarding. No obvious abdominal masses. Msk:  Strength and tone appear normal for age. Extremities: No clubbing or cyanosis. No edema.  Distal pedal pulses are 2+ and equal bilaterally. Skin: Warm and Dry Neuro: Alert and oriented X 3. CN III-XII intact Grossly normal sensory and motor function . Psych:  Responds  to questions appropriately with a normal affect.       CBC Lab Results  Component Value Date   WBC 10.3 03/23/2012   HGB 13.6 03/23/2012   HCT 39.3 03/23/2012   MCV 89.7 03/23/2012   PLT 304 03/23/2012   PROTIME: Miscellaneous Lab Results  Component Value Date   DDIMER  Value: 1.32        AT THE INHOUSE ESTABLISHED CUTOFF VALUE OF 0.48 ug/mL FEU, THIS ASSAY HAS BEEN DOCUMENTED IN THE LITERATURE TO HAVE A SENSITIVITY AND NEGATIVE PREDICTIVE VALUE OF AT LEAST 98 TO 99%.  THE TEST RESULT SHOULD BE CORRELATED WITH AN ASSESSMENT OF THE CLINICAL PROBABILITY OF DVT / VTE.* 12/17/2010    Radiology/Studies:  No results found.  EKG: Sinus rhythm at 90 Intervals 15/08/36 Axis is 38 Nonspecific T wave changes of  Assessment and Plan:   Sherryl Manges

## 2012-07-11 ENCOUNTER — Encounter (HOSPITAL_COMMUNITY): Payer: Self-pay | Admitting: Pharmacy Technician

## 2012-07-18 ENCOUNTER — Ambulatory Visit (HOSPITAL_COMMUNITY)
Admission: RE | Admit: 2012-07-18 | Discharge: 2012-07-18 | Disposition: A | Discharge status: Home / Self Care | Payer: BC Managed Care – PPO | Source: Ambulatory Visit | Attending: Internal Medicine | Admitting: Internal Medicine

## 2012-07-18 ENCOUNTER — Encounter (HOSPITAL_COMMUNITY): Payer: Self-pay | Admitting: Anesthesiology

## 2012-07-18 ENCOUNTER — Encounter (HOSPITAL_COMMUNITY)
Admission: RE | Disposition: A | Discharge status: Home / Self Care | Payer: Self-pay | Source: Ambulatory Visit | Attending: Internal Medicine

## 2012-07-18 DIAGNOSIS — R55 Syncope and collapse: Secondary | ICD-10-CM | POA: Insufficient documentation

## 2012-07-18 HISTORY — PX: TILT TABLE STUDY: SHX5493

## 2012-07-18 LAB — BASIC METABOLIC PANEL
CO2: 30 mEq/L (ref 19–32)
Chloride: 101 mEq/L (ref 96–112)
Creatinine, Ser: 0.75 mg/dL (ref 0.50–1.10)
Glucose, Bld: 83 mg/dL (ref 70–99)

## 2012-07-18 LAB — PROTIME-INR
INR: 0.96 (ref 0.00–1.49)
Prothrombin Time: 12.7 seconds (ref 11.6–15.2)

## 2012-07-18 LAB — CBC
Hemoglobin: 13.7 g/dL (ref 12.0–15.0)
MCH: 30.4 pg (ref 26.0–34.0)
MCV: 91.3 fL (ref 78.0–100.0)
Platelets: 324 10*3/uL (ref 150–400)
RBC: 4.5 MIL/uL (ref 3.87–5.11)
WBC: 10.2 10*3/uL (ref 4.0–10.5)

## 2012-07-18 SURGERY — TILT TABLE STUDY
Anesthesia: LOCAL

## 2012-07-18 NOTE — CV Procedure (Signed)
.  slop Julitza Rickles Noto [id number]  [id number]  Preop Dx: syncope Postop Dx same/   Procedure:TILT WITH NTG  Cx: None  Dictation number NOT   Negative tilt test   Sherryl Manges, MD 07/18/2012 9:31 AM

## 2012-07-18 NOTE — Interval H&P Note (Signed)
History and Physical Interval Note:  07/18/2012 7:29 AM  Angie [name]  has presented today for surgery, with the diagnosis of SYNCOPE  The various methods of treatment have been discussed with the patient and family. After consideration of risks, benefits and other options for treatment, the patient has consented to  Procedure(s) (LRB) with comments: TILT TABLE STUDY (N/A) as a surgical intervention .  The patient's history has been reviewed, patient examined, no change in status, stable for surgery.  I have reviewed the patient's chart and labs.  Questions were answered to the patient's satisfaction.     Sherryl Manges  Pt dizzy the last few days

## 2012-07-18 NOTE — H&P (View-Only) (Signed)
 ELECTROPHYSIOLOGY CONSULT NOTE  Patient ID: Angie [name], MRN: [id number], DOB/AGE: 09/08/1980 31 y.o. Admit date: (Not on file) Date of Consult: 07/08/2012  Primary Physician: SMITH,KRISTI, MD Primary Cardiologist: SW  Chief Complaint: DIZZINESS    HPI Angie [name] is a 31 y.o. female  Referred for possible dysautonomia.  She is a morbidly obese woman who has lost 50 pounds in and we gained 50 pounds over the last year and a half who in June of this year developed pneumonia. On initial questioning, she denies prior history of dizziness and presyncope but retrospectively has had symptoms that antedates this episode of pneumonia.  In August she began developing right upper quadrant pain in the setting of prior cholecystectomy and underwent an evaluation by Dr. Mann for which no specific cause was identified.\  In October she began developing problems with lightheadedness accompanied by nausea. These episodes were mostly associated with prolonged standing and then abrupt changes in position. They were occasionally associated with standing and rarely with sitting he did not happen while lying down. The duration of these episodes with a 5-15 minutes and she was described as being pale with these episodes. There is also mild diaphoresis. By lying down the lightheadedness would abate as was the nausea. If  She is noted worsening of symptoms with prolonged standing symptoms are better in the colder weather.   She does tolerate showers. She takes them hot but brief    She notes that prior to her hysterectomy a year and a half ago undertaking for endometriosis that her symptoms tended to be worse around the time of her periods which she described as modestly heavy.  Her diet is salt deplete is also fluid of late. She is troubled with depression although not sure she understands what is reactive or not.  She also snores and has significant fatigue and it is this fatigue which is limiting her now  in many ways and is interfering with her work. She has put on 50 pounds or so in the last year.    Past Medical History  Diagnosis Date  . Frequent UTI   . Asthma   . Chronic bronchitis   . Endometriosis       Surgical History:  Past Surgical History  Procedure Date  . Total abdominal hysterectomy May 2012    Right ovary still in  . Cholecystectomy June 2011  . Cesarean section   . Salpingectomy November 2004    Left sided, with adhesion lysis     Home Meds: Prior to Admission medications   Medication Sig Start Date End Date Taking? Authorizing Provider  albuterol (PROVENTIL HFA;VENTOLIN HFA) 108 (90 BASE) MCG/ACT inhaler Inhale 2 puffs into the lungs every 6 (six) hours as needed. For shortness of breath   Yes Historical Provider, MD  ALPRAZolam (XANAX) 0.25 MG tablet Take 0.25 mg by mouth at bedtime as needed.   Yes Historical Provider, MD  EPINEPHrine (EPIPEN JR) 0.15 MG/0.3ML injection Inject 0.15 mg into the muscle as needed.   Yes Historical Provider, MD  Fluticasone-Salmeterol (ADVAIR) 250-50 MCG/DOSE AEPB Inhale 1 puff into the lungs every 12 (twelve) hours.   Yes Historical Provider, MD  ibuprofen (ADVIL,MOTRIN) 800 MG tablet Take 800 mg by mouth every 8 (eight) hours as needed. For pain   Yes Historical Provider, MD  ondansetron (ZOFRAN) 8 MG tablet Take 8 mg by mouth every 8 (eight) hours as needed.   Yes Historical Provider, MD  montelukast (SINGULAIR) 10 MG tablet   Take 10 mg by mouth at bedtime.    Historical Provider, MD  omeprazole (PRILOSEC) 40 MG capsule Take 1 capsule (40 mg total) by mouth daily. 03/23/12 03/23/13  Bryan Beaver, MD  phentermine 37.5 MG capsule Take 37.5 mg by mouth every morning.    Historical Provider, MD  promethazine (PHENERGAN) 25 MG tablet Take 1 tablet (25 mg total) by mouth every 6 (six) hours as needed for nausea. 03/19/12 03/26/12  Andrew B Wallace, MD     Allergies:  Allergies  Allergen Reactions  . Amoxicillin-Pot Clavulanate  Anaphylaxis  . Penicillins Anaphylaxis and Hives  . Sulfa Antibiotics Anaphylaxis  . Cephalosporins Hives    History   Social History  . Marital Status: Legally Separated    Spouse Name: N/A    Number of Children: N/A  . Years of Education: N/A   Occupational History  . Not on file.   Social History Main Topics  . Smoking status: Current Every Day Smoker -- 1.0 packs/day for 15 years    Types: Cigarettes  . Smokeless tobacco: Not on file  . Alcohol Use: No  . Drug Use: No  . Sexually Active: Yes   Other Topics Concern  . Not on file   Social History Narrative  . 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   ROS:  Please see the history of present illness.     All other systems reviewed and negative.    Physical Exam:  Blood pressure 82/60, pulse 100, resp. rate 18, height 5' (1.524 m), weight 235 lb (106.595 kg), last menstrual period 09/19/2010, SpO2 92.00%. General: Well developed, well nourished morbidly obese Caucasian age appearing   female in no acute distress. Head: Normocephalic, atraumatic, sclera non-icteric, no xanthomas, nares are without discharge. Poor dentition  Lymph Nodes:  none Back: without scoliosis/kyphosis , no CVA tendersness Neck: Negative for carotid bruits. JVD not elevated. Lungs: Clear bilaterally to auscultation without wheezes, rales, or rhonchi. Breathing is unlabored. Heart: RRR with S1 S2. No  murmur , rubs, or gallops appreciated. Abdomen: Soft, non-tender, non-distended with normoactive bowel sounds. No hepatomegaly. No rebound/guarding. No obvious abdominal masses. Msk:  Strength and tone appear normal for age. Extremities: No clubbing or cyanosis. No edema.  Distal pedal pulses are 2+ and equal bilaterally. Skin: Warm and Dry Neuro: Alert and oriented X 3. CN III-XII intact Grossly normal sensory and motor function . Psych:  Responds  to questions appropriately with a normal affect.       CBC Lab Results  Component Value Date   WBC 10.3 03/23/2012   HGB 13.6 03/23/2012   HCT 39.3 03/23/2012   MCV 89.7 03/23/2012   PLT 304 03/23/2012   PROTIME: Miscellaneous Lab Results  Component Value Date   DDIMER  Value: 1.32        AT THE INHOUSE ESTABLISHED CUTOFF VALUE OF 0.48 ug/mL FEU, THIS ASSAY HAS BEEN DOCUMENTED IN THE LITERATURE TO HAVE A SENSITIVITY AND NEGATIVE PREDICTIVE VALUE OF AT LEAST 98 TO 99%.  THE TEST RESULT SHOULD BE CORRELATED WITH AN ASSESSMENT OF THE CLINICAL PROBABILITY OF DVT / VTE.* 12/17/2010    Radiology/Studies:  No results found.  EKG: Sinus rhythm at 90 Intervals 15/08/36 Axis is 38 Nonspecific T wave changes of  Assessment and Plan:   Elex Mainwaring   

## 2012-12-07 ENCOUNTER — Ambulatory Visit
Admission: RE | Admit: 2012-12-07 | Discharge: 2012-12-07 | Disposition: A | Discharge status: Home / Self Care | Payer: BC Managed Care – PPO | Source: Ambulatory Visit

## 2012-12-07 ENCOUNTER — Other Ambulatory Visit: Payer: Self-pay

## 2012-12-07 DIAGNOSIS — R319 Hematuria, unspecified: Secondary | ICD-10-CM

## 2012-12-07 DIAGNOSIS — R109 Unspecified abdominal pain: Secondary | ICD-10-CM

## 2013-08-22 ENCOUNTER — Emergency Department (HOSPITAL_COMMUNITY)
Admission: EM | Admit: 2013-08-22 | Discharge: 2013-08-22 | Disposition: A | Discharge status: Home / Self Care | Payer: BC Managed Care – PPO | Attending: Emergency Medicine | Admitting: Emergency Medicine

## 2013-08-22 ENCOUNTER — Emergency Department (HOSPITAL_COMMUNITY): Payer: BC Managed Care – PPO

## 2013-08-22 ENCOUNTER — Encounter (HOSPITAL_COMMUNITY): Payer: Self-pay | Admitting: Emergency Medicine

## 2013-08-22 DIAGNOSIS — J209 Acute bronchitis, unspecified: Secondary | ICD-10-CM | POA: Insufficient documentation

## 2013-08-22 DIAGNOSIS — R42 Dizziness and giddiness: Secondary | ICD-10-CM | POA: Insufficient documentation

## 2013-08-22 DIAGNOSIS — R197 Diarrhea, unspecified: Secondary | ICD-10-CM | POA: Insufficient documentation

## 2013-08-22 DIAGNOSIS — R112 Nausea with vomiting, unspecified: Secondary | ICD-10-CM | POA: Insufficient documentation

## 2013-08-22 DIAGNOSIS — R55 Syncope and collapse: Secondary | ICD-10-CM | POA: Insufficient documentation

## 2013-08-22 DIAGNOSIS — Z6841 Body Mass Index (BMI) 40.0 and over, adult: Secondary | ICD-10-CM | POA: Insufficient documentation

## 2013-08-22 DIAGNOSIS — Z880 Allergy status to penicillin: Secondary | ICD-10-CM | POA: Insufficient documentation

## 2013-08-22 DIAGNOSIS — Z8742 Personal history of other diseases of the female genital tract: Secondary | ICD-10-CM | POA: Insufficient documentation

## 2013-08-22 DIAGNOSIS — F411 Generalized anxiety disorder: Secondary | ICD-10-CM | POA: Insufficient documentation

## 2013-08-22 DIAGNOSIS — Z79899 Other long term (current) drug therapy: Secondary | ICD-10-CM | POA: Insufficient documentation

## 2013-08-22 DIAGNOSIS — F3289 Other specified depressive episodes: Secondary | ICD-10-CM | POA: Insufficient documentation

## 2013-08-22 DIAGNOSIS — J40 Bronchitis, not specified as acute or chronic: Secondary | ICD-10-CM

## 2013-08-22 DIAGNOSIS — J45909 Unspecified asthma, uncomplicated: Secondary | ICD-10-CM | POA: Insufficient documentation

## 2013-08-22 DIAGNOSIS — J111 Influenza due to unidentified influenza virus with other respiratory manifestations: Secondary | ICD-10-CM

## 2013-08-22 DIAGNOSIS — F17200 Nicotine dependence, unspecified, uncomplicated: Secondary | ICD-10-CM | POA: Insufficient documentation

## 2013-08-22 DIAGNOSIS — F329 Major depressive disorder, single episode, unspecified: Secondary | ICD-10-CM | POA: Insufficient documentation

## 2013-08-22 LAB — CBC WITH DIFFERENTIAL/PLATELET
BASOS ABS: 0.1 10*3/uL (ref 0.0–0.1)
BASOS PCT: 1 % (ref 0–1)
EOS ABS: 0.6 10*3/uL (ref 0.0–0.7)
EOS PCT: 6 % — AB (ref 0–5)
HCT: 41.8 % (ref 36.0–46.0)
Hemoglobin: 14.5 g/dL (ref 12.0–15.0)
LYMPHS PCT: 24 % (ref 12–46)
Lymphs Abs: 2.4 10*3/uL (ref 0.7–4.0)
MCH: 31.3 pg (ref 26.0–34.0)
MCHC: 34.7 g/dL (ref 30.0–36.0)
MCV: 90.3 fL (ref 78.0–100.0)
Monocytes Absolute: 0.6 10*3/uL (ref 0.1–1.0)
Monocytes Relative: 7 % (ref 3–12)
Neutro Abs: 6 10*3/uL (ref 1.7–7.7)
Neutrophils Relative %: 62 % (ref 43–77)
PLATELETS: 355 10*3/uL (ref 150–400)
RBC: 4.63 MIL/uL (ref 3.87–5.11)
RDW: 13.8 % (ref 11.5–15.5)
WBC: 9.6 10*3/uL (ref 4.0–10.5)

## 2013-08-22 LAB — COMPREHENSIVE METABOLIC PANEL
ALBUMIN: 3.8 g/dL (ref 3.5–5.2)
ALT: 13 U/L (ref 0–35)
AST: 17 U/L (ref 0–37)
Alkaline Phosphatase: 61 U/L (ref 39–117)
BUN: 5 mg/dL — ABNORMAL LOW (ref 6–23)
CALCIUM: 8.7 mg/dL (ref 8.4–10.5)
CO2: 23 meq/L (ref 19–32)
CREATININE: 0.65 mg/dL (ref 0.50–1.10)
Chloride: 98 mEq/L (ref 96–112)
GFR calc Af Amer: >90 mL/min (ref >90)
GFR calc non Af Amer: >90 mL/min (ref >90)
Glucose, Bld: 121 mg/dL — ABNORMAL HIGH (ref 70–99)
Potassium: 3.4 mEq/L — ABNORMAL LOW (ref 3.7–5.3)
SODIUM: 138 meq/L (ref 137–147)
TOTAL PROTEIN: 7.1 g/dL (ref 6.0–8.3)
Total Bilirubin: <0.2 mg/dL — ABNORMAL LOW (ref 0.3–1.2)

## 2013-08-22 LAB — URINALYSIS, ROUTINE W REFLEX MICROSCOPIC
Bilirubin Urine: NEGATIVE
GLUCOSE, UA: NEGATIVE mg/dL
HGB URINE DIPSTICK: NEGATIVE
Ketones, ur: NEGATIVE mg/dL
LEUKOCYTES UA: NEGATIVE
Nitrite: NEGATIVE
Protein, ur: NEGATIVE mg/dL
SPECIFIC GRAVITY, URINE: 1.015 (ref 1.005–1.030)
Urobilinogen, UA: 0.2 mg/dL (ref 0.0–1.0)
pH: 6 (ref 5.0–8.0)

## 2013-08-22 LAB — LIPASE, BLOOD: Lipase: 16 U/L (ref 11–59)

## 2013-08-22 MED ORDER — ALBUTEROL SULFATE HFA 108 (90 BASE) MCG/ACT IN AERS
2.0000 | INHALATION_SPRAY | RESPIRATORY_TRACT | Status: DC | PRN
  Administered 2013-08-22: 2 via RESPIRATORY_TRACT
  Filled 2013-08-22: qty 6.7

## 2013-08-22 MED ORDER — SODIUM CHLORIDE 0.9 % IV BOLUS (SEPSIS)
1000.0000 mL | Freq: Once | INTRAVENOUS | Status: AC
  Administered 2013-08-22: 1000 mL via INTRAVENOUS

## 2013-08-22 MED ORDER — PROMETHAZINE HCL 25 MG PO TABS: 25.0000 mg | ORAL_TABLET | Freq: Four times a day (QID) | ORAL | Status: DC | PRN

## 2013-08-22 MED ORDER — KETOROLAC TROMETHAMINE 30 MG/ML IJ SOLN
30.0000 mg | Freq: Once | INTRAMUSCULAR | Status: AC
  Administered 2013-08-22: 30 mg via INTRAVENOUS
  Filled 2013-08-22: qty 1

## 2013-08-22 MED ORDER — DIAZEPAM 5 MG/ML IJ SOLN
2.5000 mg | Freq: Once | INTRAMUSCULAR | Status: AC
  Administered 2013-08-22: 2.5 mg via INTRAVENOUS
  Filled 2013-08-22: qty 2

## 2013-08-22 MED ORDER — AZITHROMYCIN 250 MG PO TABS: 250.0000 mg | ORAL_TABLET | Freq: Every day | ORAL | Status: DC

## 2013-08-22 MED ORDER — ONDANSETRON HCL 4 MG/2ML IJ SOLN
4.0000 mg | Freq: Once | INTRAMUSCULAR | Status: AC
  Administered 2013-08-22: 4 mg via INTRAVENOUS
  Filled 2013-08-22: qty 2

## 2013-08-22 NOTE — ED Notes (Addendum)
Started to feel bad 3 days ago  Vomiting diarrhea and nausea states passed out earlier today she  Lots of diarrhea also has worsening cough and bronchitis

## 2013-08-22 NOTE — Discharge Instructions (Signed)
Bronchitis °Bronchitis is inflammation of the airways that extend from the windpipe into the lungs (bronchi). The inflammation often causes mucus to develop, which leads to a cough. If the inflammation becomes severe, it may cause shortness of breath. °CAUSES  °Bronchitis may be caused by:  °· Viral infections.   °· Bacteria.   °· Cigarette smoke.   °· Allergens, pollutants, and other irritants.   °SIGNS AND SYMPTOMS  °The most common symptom of bronchitis is a frequent cough that produces mucus. Other symptoms include: °· Fever.   °· Body aches.   °· Chest congestion.   °· Chills.   °· Shortness of breath.   °· Sore throat.   °DIAGNOSIS  °Bronchitis is usually diagnosed through a medical history and physical exam. Tests, such as chest X-rays, are sometimes done to rule out other conditions.  °TREATMENT  °You may need to avoid contact with whatever caused the problem (smoking, for example). Medicines are sometimes needed. These may include: °· Antibiotics. These may be prescribed if the condition is caused by bacteria. °· Cough suppressants. These may be prescribed for relief of cough symptoms.   °· Inhaled medicines. These may be prescribed to help open your airways and make it easier for you to breathe.   °· Steroid medicines. These may be prescribed for those with recurrent (chronic) bronchitis. °HOME CARE INSTRUCTIONS °· Get plenty of rest.   °· Drink enough fluids to keep your urine clear or pale yellow (unless you have a medical condition that requires fluid restriction). Increasing fluids may help thin your secretions and will prevent dehydration.   °· Only take over-the-counter or prescription medicines as directed by your health care provider. °· Only take antibiotics as directed. Make sure you finish them even if you start to feel better. °· Avoid secondhand smoke, irritating chemicals, and strong fumes. These will make bronchitis worse. If you are a smoker, quit smoking. Consider using nicotine gum or  skin patches to help control withdrawal symptoms. Quitting smoking will help your lungs heal faster.   °· Put a cool-mist humidifier in your bedroom at night to moisten the air. This may help loosen mucus. Change the water in the humidifier daily. You can also run the hot water in your shower and sit in the bathroom with the door closed for 5 10 minutes.   °· Follow up with your health care provider as directed.   °· Wash your hands frequently to avoid catching bronchitis again or spreading an infection to others.   °SEEK MEDICAL CARE IF: °Your symptoms do not improve after 1 week of treatment.  °SEEK IMMEDIATE MEDICAL CARE IF: °· Your fever increases. °· You have chills.   °· You have chest pain.   °· You have worsening shortness of breath.   °· You have bloody sputum. °· You faint.   °· You have lightheadedness. °· You have a severe headache.   °· You vomit repeatedly. °MAKE SURE YOU:  °· Understand these instructions. °· Will watch your condition. °· Will get help right away if you are not doing well or get worse. °Document Released: 07/20/2005 Document Revised: 05/10/2013 Document Reviewed: 03/14/2013 °[employer]® Patient Information ©2014 [employer]. ° °Influenza, Adult °Influenza ("the flu") is a viral infection of the respiratory tract. It occurs more often in winter months because people spend more time in close contact with one another. Influenza can make you feel very sick. Influenza easily spreads from person to person (contagious). °CAUSES  °Influenza is caused by a virus that infects the respiratory tract. You can catch the virus by breathing in droplets from an infected person's cough or sneeze. You   can also catch the virus by touching something that was recently contaminated with the virus and then touching your mouth, nose, or eyes. °SYMPTOMS  °Symptoms typically last 4 to 10 days and may include: °· Fever. °· Chills. °· Headache, body aches, and muscle aches. °· Sore throat. °· Chest discomfort and  cough. °· Poor appetite. °· Weakness or feeling tired. °· Dizziness. °· Nausea or vomiting. °DIAGNOSIS  °Diagnosis of influenza is often made based on your history and a physical exam. A nose or throat swab test can be done to confirm the diagnosis. °RISKS AND COMPLICATIONS °You may be at risk for a more severe case of influenza if you smoke cigarettes, have diabetes, have chronic heart disease (such as heart failure) or lung disease (such as asthma), or if you have a weakened immune system. Elderly people and pregnant women are also at risk for more serious infections. The most common complication of influenza is a lung infection (pneumonia). Sometimes, this complication can require emergency medical care and may be life-threatening. °PREVENTION  °An annual influenza vaccination (flu shot) is the best way to avoid getting influenza. An annual flu shot is now routinely recommended for all adults in the [place] °TREATMENT  °In mild cases, influenza goes away on its own. Treatment is directed at relieving symptoms. For more severe cases, your caregiver may prescribe antiviral medicines to shorten the sickness. Antibiotic medicines are not effective, because the infection is caused by a virus, not by bacteria. °HOME CARE INSTRUCTIONS °· Only take over-the-counter or prescription medicines for pain, discomfort, or fever as directed by your caregiver. °· Use a cool mist humidifier to make breathing easier. °· Get plenty of rest until your temperature returns to normal. This usually takes 3 to 4 days. °· Drink enough fluids to keep your urine clear or pale yellow. °· Cover your mouth and nose when coughing or sneezing, and wash your hands well to avoid spreading the virus. °· Stay home from work or school until your fever has been gone for at least 1 full day. °SEEK MEDICAL CARE IF:  °· You have chest pain or a deep cough that worsens or produces more mucus. °· You have nausea, vomiting, or diarrhea. °SEEK IMMEDIATE MEDICAL  CARE IF:  °· You have difficulty breathing, shortness of breath, or your skin or nails turn bluish. °· You have severe neck pain or stiffness. °· You have a severe headache, facial pain, or earache. °· You have a worsening or recurring fever. °· You have nausea or vomiting that cannot be controlled. °MAKE SURE YOU: °· Understand these instructions. °· Will watch your condition. °· Will get help right away if you are not doing well or get worse. °Document Released: 07/17/2000 Document Revised: 01/19/2012 Document Reviewed: 10/19/2011 °[employer]® Patient Information ©2014 [employer]. ° ° °

## 2013-08-22 NOTE — ED Provider Notes (Signed)
CSN: [id number]     Arrival date & time 08/22/13  1409 History   First MD Initiated Contact with Patient 08/22/13 1745     Chief Complaint  Patient presents with  . Emesis  . Diarrhea  . Cough   (Consider location/radiation/quality/duration/timing/severity/associated sxs/prior Treatment) HPI  Patient presents to the emergency department with complaints of feeling bad for 3 days with nausea, vomiting, diarrhea, coughing, bodyaches. [facility] and her flu test turned positive. She is offered Tamiflu but declined since it is so expensive. Since then she feels as though she has been having significant quantities of watery diarrhea and not able to keep enough fluid down. She quickly sat up today and went to walk then got really lightheaded and had a near syncopal episode. She comes to the ER today because she is concerned she might be dehydrated and needed fluids because of significant diarrhea. Her husband caught her when she syncopized she did not hit her head. She is also not complaining of fevers, head injury, neck pain. She is not confused and her vital signs are stable.  Past Medical History  Diagnosis Date  . Dizziness   . Asthma   . Chronic bronchitis   . Endometriosis   . Anxiety   . Morbid obesity with BMI of 40.0-44.9, adult   . Abdominal pain   . DEPRESSION 09/20/2007    Qualifier: Diagnosis of  By: Jillyn Hidden FNP, Mcarthur Rossetti    Past Surgical History  Procedure Laterality Date  . Total abdominal hysterectomy  May 2012    Right ovary still in  . Cholecystectomy  June 2011  . Cesarean section    . Salpingectomy  November 2004    Left sided, with adhesion lysis   No family history on file. History  Substance Use Topics  . Smoking status: Current Every Day Smoker -- 1.00 packs/day for 15 years    Types: Cigarettes  . Smokeless tobacco: Not on file  . Alcohol Use: No   OB History   Grav Para Term Preterm Abortions TAB SAB Ect Mult Living         Review of Systems The patient denies anorexia, fever, weight loss,, vision loss, decreased hearing, hoarseness, chest pain, syncope, dyspnea on exertion, peripheral edema, balance deficits, hemoptysis, abdominal pain, melena, hematochezia, severe indigestion/heartburn, hematuria, incontinence, genital sores, muscle weakness, suspicious skin lesions, transient blindness, difficulty walking, depression, unusual weight change, abnormal bleeding, enlarged lymph nodes, angioedema, and breast masses.  Allergies  Amoxicillin-pot clavulanate; Penicillins; Sulfa antibiotics; and Cephalosporins  Home Medications   Current Outpatient Rx  Name  Route  Sig  Dispense  Refill  . albuterol (PROVENTIL HFA;VENTOLIN HFA) 108 (90 BASE) MCG/ACT inhaler   Inhalation   Inhale 2 puffs into the lungs every 6 (six) hours as needed. For shortness of breath         . ALPRAZolam (XANAX) 0.25 MG tablet   Oral   Take 0.25 mg by mouth at bedtime as needed for anxiety or sleep.          Marland Kitchen amphetamine-dextroamphetamine (ADDERALL) 20 MG tablet   Oral   Take 20 mg by mouth 2 (two) times daily.         . cyclobenzaprine (FLEXERIL) 10 MG tablet   Oral   Take 10 mg by mouth 3 (three) times daily as needed for muscle spasms.         Marland Kitchen escitalopram (LEXAPRO) 10 MG tablet   Oral  Take 10 mg by mouth at bedtime.         Marland Kitchen guaiFENesin-codeine (ROBITUSSIN AC) 100-10 MG/5ML syrup   Oral   Take 10 mLs by mouth 3 (three) times daily as needed for cough.         Marland Kitchen ibuprofen (ADVIL,MOTRIN) 800 MG tablet   Oral   Take 800 mg by mouth every 8 (eight) hours as needed. For pain         . montelukast (SINGULAIR) 10 MG tablet   Oral   Take 10 mg by mouth at bedtime.         Marland Kitchen azithromycin (ZITHROMAX) 250 MG tablet   Oral   Take 1 tablet (250 mg total) by mouth daily. Take first 2 tablets together, then 1 every day until finished.   6 tablet   0   . EPINEPHrine (EPIPEN) 0.3 mg/0.3 mL DEVI    Intramuscular   Inject 0.3 mg into the muscle once.         . promethazine (PHENERGAN) 25 MG tablet   Oral   Take 1 tablet (25 mg total) by mouth every 6 (six) hours as needed for nausea or vomiting.   30 tablet   0    BP 92/61  Pulse 83  Temp(Src) 98.4 F (36.9 C) (Oral)  Resp 20  SpO2 98%  LMP 09/19/2010 Physical Exam  Nursing note and vitals reviewed. Constitutional: She is oriented to person, place, and time. She appears well-developed and well-nourished. No distress.  HENT:  Head: Normocephalic and atraumatic.  Right Ear: External ear normal.  Left Ear: External ear normal.  Mouth/Throat: Oropharynx is clear and moist.  Eyes: Pupils are equal, round, and reactive to light.  Neck: Normal range of motion. Neck supple.  Cardiovascular: Normal rate and regular rhythm.   Pulmonary/Chest: Effort normal. She has no wheezes. She has no rales.  Abdominal: Soft. She exhibits no distension. There is no tenderness. There is no rebound.  Neurological: She is alert and oriented to person, place, and time.  Skin: Skin is warm and dry.    ED Course  Procedures (including critical care time) Labs Review Labs Reviewed  CBC WITH DIFFERENTIAL - Abnormal; Notable for the following:    Eosinophils Relative 6 (*)    All other components within normal limits  COMPREHENSIVE METABOLIC PANEL - Abnormal; Notable for the following:    Potassium 3.4 (*)    Glucose, Bld 121 (*)    BUN 5 (*)    Total Bilirubin <0.2 (*)    All other components within normal limits  URINALYSIS, ROUTINE W REFLEX MICROSCOPIC - Abnormal; Notable for the following:    APPearance CLOUDY (*)    All other components within normal limits  LIPASE, BLOOD   Imaging Review Dg Chest 2 View  08/22/2013   CLINICAL DATA:  Short of breath, cough, chest pain  EXAM: CHEST  2 VIEW  COMPARISON:  Prior chest x-ray 11/30/2005; prior CT PE study 12/17/2010  FINDINGS: Slightly increased peribronchial thickening and diffuse mild  interstitial prominence. No focal airspace consolidation, pulmonary edema, pleural effusion or pneumothorax. No acute osseous abnormality. Surgical clips in the right upper quadrant suggest prior cholecystectomy.  IMPRESSION: Slightly increased peribronchial thickening and mild interstitial prominence compared to prior may reflect acute bronchitis, or an atypical/viral respiratory infection.   Electronically Signed   By: Malachy Moan M.D.   On: 08/22/2013 16:30    EKG Interpretation   None       MDM  1. Bronchitis   2. Influenza     Patient is clinically dehydrated but fortunately none of her lab work is reflecting this. Pt has been coughing for a while and then developed flu symptoms. She has had a negative flu test. Her Chest xray shows possible bronchitis. She works at a pharmacy. Will give a liter of fluids in ED for rehydration and Rx: Phenergan, Albuterol inhaler, Z-pack  Advised to return to the ED if she has another near syncopal or syncopal episode  33 y.o.Brand MalesSarah M Sakata's evaluation in the Emergency Department is complete. It has been determined that no acute conditions requiring further emergency intervention are present at this time. The patient/guardian have been advised of the diagnosis and plan. We have discussed signs and symptoms that warrant return to the ED, such as changes or worsening in symptoms.  Vital signs are stable at discharge. Filed Vitals:   08/22/13 2003  BP:   Pulse:   Temp: 98.4 F (36.9 C)  Resp: 20    Patient/guardian has voiced understanding and agreed to follow-up with the PCP or specialist.     Dorthula Matasiffany G Oceane Fosse, PA-C 08/23/13 0002

## 2013-08-22 NOTE — ED Notes (Signed)
Pt. Diagnosed yesterday with bronchitis and the flu. Symptoms including: N/V/D, cough, generalized body aches, fever. Pt. States today passed out and symptoms have not improved with medication.

## 2013-08-29 NOTE — ED Provider Notes (Addendum)
Medical screening examination/treatment/procedure(s) were conducted as a shared visit with non-physician practitioner(s) and myself.  I personally evaluated the patient during the encounter.  EKG Interpretation   None         Shelda JakesScott W. Hidaya Daniel, MD 08/29/13 1441    Correction:  Patient was not a shared visit Medical screening examination/treatment/procedure(s) were performed by non-physician practitioner and as supervising physician I was immediately available for consultation/collaboration.  EKG Interpretation   None         Shelda JakesScott W. Eller Sweis, MD 09/05/13 [phone]

## 2013-12-04 ENCOUNTER — Encounter (HOSPITAL_COMMUNITY): Payer: Self-pay | Admitting: Emergency Medicine

## 2013-12-04 ENCOUNTER — Emergency Department (INDEPENDENT_AMBULATORY_CARE_PROVIDER_SITE_OTHER)
Admission: EM | Admit: 2013-12-04 | Discharge: 2013-12-04 | Disposition: A | Discharge status: Home / Self Care | Payer: BC Managed Care – PPO | Source: Home / Self Care | Attending: Family Medicine | Admitting: Family Medicine

## 2013-12-04 DIAGNOSIS — K029 Dental caries, unspecified: Secondary | ICD-10-CM

## 2013-12-04 DIAGNOSIS — B370 Candidal stomatitis: Secondary | ICD-10-CM

## 2013-12-04 MED ORDER — NYSTATIN 100000 UNIT/ML MT SUSP: 500000.0000 [IU] | Freq: Four times a day (QID) | OROMUCOSAL | Status: DC

## 2013-12-04 MED ORDER — HYDROCODONE-ACETAMINOPHEN 5-325 MG PO TABS: 1.0000 | ORAL_TABLET | Freq: Four times a day (QID) | ORAL | Status: DC | PRN

## 2013-12-04 MED ORDER — KETOROLAC TROMETHAMINE 30 MG/ML IJ SOLN
30.0000 mg | Freq: Once | INTRAMUSCULAR | Status: AC
  Administered 2013-12-04: 30 mg via INTRAMUSCULAR

## 2013-12-04 MED ORDER — CLINDAMYCIN HCL 300 MG PO CAPS: 300.0000 mg | ORAL_CAPSULE | Freq: Four times a day (QID) | ORAL | Status: DC

## 2013-12-04 MED ORDER — KETOROLAC TROMETHAMINE 30 MG/ML IJ SOLN
INTRAMUSCULAR | Status: AC
  Filled 2013-12-04: qty 1

## 2013-12-04 NOTE — ED Provider Notes (Signed)
CSN: [id number]     Arrival date & time 12/04/13  1920 History   First MD Initiated Contact with Patient 12/04/13 2017     Chief Complaint  Patient presents with  . Oral Swelling   (Consider location/radiation/quality/duration/timing/severity/associated sxs/prior Treatment) HPI Comments: 4 days of right upper dental and right facial pain with mild swelling that has not responded favorably to an old Rx of cephalexin that she found at her house and began taking on 12-02-2013. States she is awaiting extraction of all of her remaining teeth, but her dentist's office is closed for remodeling.   The history is provided by the patient.    Past Medical History  Diagnosis Date  . Dizziness   . Asthma   . Chronic bronchitis   . Endometriosis   . Anxiety   . Morbid obesity with BMI of 40.0-44.9, adult   . Abdominal pain   . DEPRESSION 09/20/2007    Qualifier: Diagnosis of  By: Jillyn HiddenBean FNP, Mcarthur RossettiBillie-Lynn Daniels    Past Surgical History  Procedure Laterality Date  . Total abdominal hysterectomy  May 2012    Right ovary still in  . Cholecystectomy  June 2011  . Cesarean section    . Salpingectomy  November 2004    Left sided, with adhesion lysis   Family History  Problem Relation Age of Onset  . Cancer Mother     colon cancer  . Heart failure Father     4 vessel bypass   History  Substance Use Topics  . Smoking status: Current Every Day Smoker -- 1.00 packs/day for 15 years    Types: Cigarettes  . Smokeless tobacco: Not on file  . Alcohol Use: No   OB History   Grav Para Term Preterm Abortions TAB SAB Ect Mult Living                 Review of Systems  All other systems reviewed and are negative.   Allergies  Amoxicillin-pot clavulanate; Penicillins; Sulfa antibiotics; and Cephalosporins  Home Medications   Prior to Admission medications   Medication Sig Start Date End Date Taking? Authorizing Provider  albuterol (PROVENTIL HFA;VENTOLIN HFA) 108 (90 BASE) MCG/ACT inhaler  Inhale 2 puffs into the lungs every 6 (six) hours as needed. For shortness of breath    Historical Provider, MD  ALPRAZolam (XANAX) 0.25 MG tablet Take 0.25 mg by mouth at bedtime as needed for anxiety or sleep.     Historical Provider, MD  amphetamine-dextroamphetamine (ADDERALL) 20 MG tablet Take 20 mg by mouth 2 (two) times daily.    Historical Provider, MD  azithromycin (ZITHROMAX) 250 MG tablet Take 1 tablet (250 mg total) by mouth daily. Take first 2 tablets together, then 1 every day until finished. 08/22/13   Tiffany Irine SealG Greene, PA-C  cyclobenzaprine (FLEXERIL) 10 MG tablet Take 10 mg by mouth 3 (three) times daily as needed for muscle spasms.    Historical Provider, MD  EPINEPHrine (EPIPEN) 0.3 mg/0.3 mL DEVI Inject 0.3 mg into the muscle once.    Historical Provider, MD  escitalopram (LEXAPRO) 10 MG tablet Take 10 mg by mouth at bedtime.    Historical Provider, MD  guaiFENesin-codeine (ROBITUSSIN AC) 100-10 MG/5ML syrup Take 10 mLs by mouth 3 (three) times daily as needed for cough.    Historical Provider, MD  ibuprofen (ADVIL,MOTRIN) 800 MG tablet Take 800 mg by mouth every 8 (eight) hours as needed. For pain    Historical Provider, MD  montelukast (SINGULAIR) 10 MG tablet  Take 10 mg by mouth at bedtime.    Historical Provider, MD  promethazine (PHENERGAN) 25 MG tablet Take 1 tablet (25 mg total) by mouth every 6 (six) hours as needed for nausea or vomiting. 08/22/13   Dorthula Matasiffany G Greene, PA-C   BP 132/77  Pulse 112  Temp(Src) 98.3 F (36.8 C) (Oral)  Resp 18  SpO2 100%  LMP 09/19/2010 Physical Exam  Nursing note and vitals reviewed. Constitutional: She is oriented to person, place, and time. She appears well-developed and well-nourished. No distress.  HENT:  Head: Normocephalic and atraumatic.  Right Ear: Hearing and external ear normal.  Left Ear: Hearing and external ear normal.  Nose: Nose normal.  Mouth/Throat: Uvula is midline, oropharynx is clear and moist and mucous membranes  are normal. No trismus in the jaw.  Countless dental carries with multiple missing teeth. Those that are present have extensive decay. Reports tenderness in region of teeth #'s 2-4. + widespread gumline erythema without focal gumline swelling or drainage. +mild right facial swelling.  Patient also has diffuse patches of white adherent plaques on buccal mucosa, tongue and posterior soft palate.   Eyes: Conjunctivae are normal.  Neck: Normal range of motion. Neck supple.  Cardiovascular:  +mild tachycardia  Pulmonary/Chest: Effort normal.  Musculoskeletal: Normal range of motion.  Lymphadenopathy:    She has no cervical adenopathy.  Neurological: She is alert and oriented to person, place, and time.  Skin: Skin is warm and dry. No rash noted.  Psychiatric: She has a normal mood and affect. Her behavior is normal.    ED Course  Procedures (including critical care time) Labs Review Labs Reviewed - No data to display  Imaging Review No results found.   MDM   1. Pain due to dental caries   2. Oral thrush    Suspect right upper periapical abscess. Will treat with dental issue with oral clindamycin Patient also has thrush and will treat this with oral Nystatin suspension and suggested follow up with her PCP if this condition does not improve. Patient reports no known hx of immunosuppression.   Jess BartersJennifer Lee LambertonPresson, GeorgiaPA 12/05/13 [phone]

## 2013-12-04 NOTE — Discharge Instructions (Signed)
Dental Caries  Dental caries (also called tooth decay) is the most common oral disease. It can occur at any age, but is more common in children and young adults.  HOW DENTAL CARIES DEVELOPS  The process of decay begins when bacteria and foods (particularly sugars and starches) combine in your mouth to produce plaque. Plaque is a substance that sticks to the hard, outer surface of a tooth (enamel). The bacteria in plaque produce acids that attack enamel. These acids may also attack the root surface of a tooth (cementum) if it is exposed. Repeated attacks dissolve these surfaces and create holes in the tooth (cavities). If left untreated, the acids destroy the other layers of the tooth.  RISK FACTORS  Frequent sipping of sugary beverages.   Frequent snacking on sugary and starchy foods, especially those that easily get stuck in the teeth.   Poor oral hygiene.   Dry mouth.   Substance abuse such as methamphetamine abuse.   Broken or poor-fitting dental restorations.   Eating disorders.   Gastroesophageal reflux disease (GERD).   Certain radiation treatments to the head and neck. SYMPTOMS In the early stages of dental caries, symptoms are seldom present. Sometimes white, chalky areas may be seen on the enamel or other tooth layers. In later stages, symptoms may include:  Pits and holes on the enamel.  Toothache after sweet, hot, or cold foods or drinks are consumed.  Pain around the tooth.  Swelling around the tooth. DIAGNOSIS  Most of the time, dental caries is detected during a regular dental checkup. A diagnosis is made after a thorough medical and dental history is taken and the surfaces of your teeth are checked for signs of dental caries. Sometimes special instruments, such as lasers, are used to check for dental caries. Dental X-ray exams may be taken so that areas not visible to the eye (such as between the contact areas of the teeth) can be checked for cavities.    TREATMENT  If dental caries is in its early stages, it may be reversed with a fluoride treatment or an application of a remineralizing agent at the dental office. Thorough brushing and flossing at home is needed to aid these treatments. If it is in its later stages, treatment depends on the location and extent of tooth destruction:   If a small area of the tooth has been destroyed, the destroyed area will be removed and cavities will be filled with a material such as gold, silver amalgam, or composite resin.   If a large area of the tooth has been destroyed, the destroyed area will be removed and a cap (crown) will be fitted over the remaining tooth structure.   If the center part of the tooth (pulp) is affected, a procedure called a root canal will be needed before a filling or crown can be placed.   If most of the tooth has been destroyed, the tooth may need to be pulled (extracted). HOME CARE INSTRUCTIONS You can prevent, stop, or reverse dental caries at home by practicing good oral hygiene. Good oral hygiene includes:  Thoroughly cleaning your teeth at least twice a day with a toothbrush and dental floss.   Using a fluoride toothpaste. A fluoride mouth rinse may also be used if recommended by your dentist or health care provider.   Restricting the amount of sugary and starchy foods and sugary liquids you consume.   Avoiding frequent snacking on these foods and sipping of these liquids.   Keeping regular visits  with a dentist for checkups and cleanings. PREVENTION   Practice good oral hygiene.  Consider a dental sealant. A dental sealant is a coating material that is applied by your dentist to the pits and grooves of teeth. The sealant prevents food from being trapped in them. It may protect the teeth for several years.  Ask about fluoride supplements if you live in a community without fluorinated water or with water that has a low fluoride content. Use fluoride supplements  as directed by your dentist or health care provider.  Allow fluoride varnish applications to teeth if directed by your dentist or health care provider. Document Released: 04/11/2002 Document Revised: 03/22/2013 Document Reviewed: 07/22/2012 [facility] Patient Information 2014 [place], [place].  Thrush, Adult  Angie Pitmanhrush, also called oral candidiasis, is a fungal infection that develops in the mouth and throat and on the tongue. It causes white patches to form on the mouth and tongue. Angie Pitmanhrush is most common in older adults, but it can occur at any age.  Many cases of thrush are mild, but this infection can also be more serious. Angie Pitmanhrush can be a recurring problem for people who have chronic illnesses or who take medicines that limit the body's ability to fight infection. Because these people have difficulty fighting infections, the fungus that causes thrush can spread throughout the body. This can cause life-threatening blood or organ infections. CAUSES  Angie Pitmanhrush is usually caused by a yeast called Candida albicans. This fungus is normally present in small amounts in the mouth and on other mucous membranes. It usually causes no harm. However, when conditions are present that allow the fungus to grow uncontrolled, it invades surrounding tissues and becomes an infection. Less often, other Candida species can also lead to thrush.  RISK FACTORS Angie Pitmanhrush is more likely to develop in the following people:  People with an impaired ability to fight infection (weakened immune system).   Older adults.   People with HIV.   People with diabetes.   People with dry mouth (xerostomia).   Pregnant women.   People with poor dental care, especially those who have false teeth.   People who use antibiotic medicines.  SIGNS AND SYMPTOMS  Angie Pitmanhrush can be a mild infection that causes no symptoms. If symptoms develop, they may include:   A burning feeling in the mouth and throat. This can occur at the start of a  thrush infection.   White patches that adhere to the mouth and tongue. The tissue around the patches may be red, raw, and painful. If rubbed (during tooth brushing, for example), the patches and the tissue of the mouth may bleed easily.   A bad taste in the mouth or difficulty tasting foods.   Cottony feeling in the mouth.   Pain during eating and swallowing. DIAGNOSIS  Your health care provider can usually diagnose thrush by looking in your mouth and asking you questions about your health.  TREATMENT  Medicines that help prevent the growth of fungi (antifungals) are the standard treatment for thrush. These medicines are either applied directly to the affected area (topical) or swallowed (oral). The treatment will depend on the severity of the condition.  Mild Thrush Mild cases of thrush may clear up with the use of an antifungal mouth rinse or lozenges. Treatment usually lasts about 14 days.  Moderate to Severe Thrush  More severe thrush infections that have spread to the esophagus are treated with an oral antifungal medicine. A topical antifungal medicine may also be used.  For some severe infections, a treatment period longer than 14 days may be needed.   Oral antifungal medicines are almost never used during pregnancy because the fetus may be harmed. However, if a pregnant woman has a rare, severe thrush infection that has spread to her blood, oral antifungal medicines may be used. In this case, the risk of harm to the mother and fetus from the severe thrush infection may be greater than the risk posed by the use of antifungal medicines.  Persistent or Recurrent Thrush For cases of thrush that do not go away or keep coming back, treatment may involve the following:   Treatment may be needed twice as long as the symptoms last.   Treatment will include both oral and topical antifungal medicines.   People with weakened immune systems can take an antifungal medicine on a  continuous basis to prevent thrush infections.  It is important to treat conditions that make you more likely to get thrush, such as diabetes or HIV.  HOME CARE INSTRUCTIONS   Only take over-the-counter or prescription medicine as directed by your health care provider. Talk to your health care provider about an over-the-counter medicine called gentian violet, which kills bacteria and fungi.   Eat plain, unflavored yogurt as directed by your health care provider. Check the label to make sure the yogurt contains live cultures. This yogurt can help healthy bacteria grow in the mouth that can stop the growth of the fungus that causes thrush.   Try these measures to help reduce the discomfort of thrush:   Drink cold liquids such as water or iced tea.   Try flavored ice treats or frozen juices.   Eat foods that are easy to swallow, such as gelatin, ice cream, or custard.   If the patches in your mouth are painful, try drinking from a straw.   Rinse your mouth several times a day with a warm saltwater rinse. You can make the saltwater mixture with 1 tsp (6 g) of salt in 8 fl oz (0.2 L) of warm water.   If you wear dentures, remove the dentures before going to bed, brush them vigorously, and soak them in a cleaning solution as directed by your health care provider.   Women who are breastfeeding should clean their nipples with an antifungal medicine as directed by their health care provider. Dry the nipples after breastfeeding. Applying lanolin-containing body lotion may help relieve nipple soreness.  SEEK MEDICAL CARE IF:  Your symptoms are getting worse or are not improving within 7 days of starting treatment.   You have symptoms of spreading infection, such as white patches on the skin outside of the mouth.   You are nursing and you have redness, burning, or pain in the nipples that is not relieved with treatment.  MAKE SURE YOU:  Understand these instructions.  Will watch  your condition.  Will get help right away if you are not doing well or get worse. Document Released: 04/14/2004 Document Revised: 05/10/2013 Document Reviewed: 02/20/2013 [facility] CenterExitCare Patient Information 2014 [place], [place]LL[place].

## 2013-12-04 NOTE — ED Notes (Signed)
C/o swelling to R upper front tooth onset Sat. with pain.  Goes to [facility] but they are shut down while they are moving.

## 2013-12-05 NOTE — ED Provider Notes (Signed)
Medical screening examination/treatment/procedure(s) were performed by resident physician or non-physician practitioner and as supervising physician I was immediately available for consultation/collaboration.   Barkley BrunsKINDL,Delvis Kau DOUGLAS MD.   Linna HoffJames D Roena Sassaman, MD 12/05/13 [id number]

## 2014-05-31 ENCOUNTER — Encounter (HOSPITAL_COMMUNITY): Payer: Self-pay | Admitting: Emergency Medicine

## 2014-05-31 ENCOUNTER — Emergency Department (HOSPITAL_COMMUNITY)
Admission: EM | Admit: 2014-05-31 | Discharge: 2014-06-01 | Disposition: A | Discharge status: Home / Self Care | Payer: BC Managed Care – PPO | Attending: Emergency Medicine | Admitting: Emergency Medicine

## 2014-05-31 DIAGNOSIS — Z7951 Long term (current) use of inhaled steroids: Secondary | ICD-10-CM | POA: Diagnosis not present

## 2014-05-31 DIAGNOSIS — Z8742 Personal history of other diseases of the female genital tract: Secondary | ICD-10-CM | POA: Insufficient documentation

## 2014-05-31 DIAGNOSIS — Z720 Tobacco use: Secondary | ICD-10-CM | POA: Diagnosis not present

## 2014-05-31 DIAGNOSIS — J45909 Unspecified asthma, uncomplicated: Secondary | ICD-10-CM | POA: Insufficient documentation

## 2014-05-31 DIAGNOSIS — R51 Headache: Secondary | ICD-10-CM | POA: Diagnosis not present

## 2014-05-31 DIAGNOSIS — F419 Anxiety disorder, unspecified: Secondary | ICD-10-CM | POA: Insufficient documentation

## 2014-05-31 DIAGNOSIS — Z79899 Other long term (current) drug therapy: Secondary | ICD-10-CM | POA: Insufficient documentation

## 2014-05-31 DIAGNOSIS — F329 Major depressive disorder, single episode, unspecified: Secondary | ICD-10-CM | POA: Diagnosis not present

## 2014-05-31 DIAGNOSIS — Z880 Allergy status to penicillin: Secondary | ICD-10-CM | POA: Insufficient documentation

## 2014-05-31 MED ORDER — PROCHLORPERAZINE EDISYLATE 5 MG/ML IJ SOLN
10.0000 mg | Freq: Once | INTRAMUSCULAR | Status: AC
  Administered 2014-06-01: 10 mg via INTRAVENOUS
  Filled 2014-05-31: qty 2

## 2014-05-31 MED ORDER — SODIUM CHLORIDE 0.9 % IV BOLUS (SEPSIS)
1000.0000 mL | Freq: Once | INTRAVENOUS | Status: AC
  Administered 2014-06-01: 1000 mL via INTRAVENOUS

## 2014-05-31 MED ORDER — KETOROLAC TROMETHAMINE 30 MG/ML IJ SOLN
30.0000 mg | Freq: Once | INTRAMUSCULAR | Status: AC
  Administered 2014-06-01: 30 mg via INTRAVENOUS
  Filled 2014-05-31: qty 1

## 2014-05-31 MED ORDER — DIPHENHYDRAMINE HCL 50 MG/ML IJ SOLN
25.0000 mg | Freq: Once | INTRAMUSCULAR | Status: AC
  Administered 2014-06-01: 25 mg via INTRAVENOUS
  Filled 2014-05-31: qty 1
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
## 2014-05-31 NOTE — ED Notes (Signed)
Pt. reports anxiety attack onset today , pt. stated she has taken her Clonazepam for anxiety this afternoon with no relief. Denies hallucinations / no suicidal ideation .

## 2014-06-01 LAB — CBC WITH DIFFERENTIAL/PLATELET
Basophils Absolute: 0 10*3/uL (ref 0.0–0.1)
Basophils Relative: 0 % (ref 0–1)
Eosinophils Absolute: 0.3 10*3/uL (ref 0.0–0.7)
Eosinophils Relative: 3 % (ref 0–5)
HCT: 38.9 % (ref 36.0–46.0)
HEMOGLOBIN: 13.1 g/dL (ref 12.0–15.0)
Lymphocytes Relative: 40 % (ref 12–46)
Lymphs Abs: 3.6 10*3/uL (ref 0.7–4.0)
MCH: 30.5 pg (ref 26.0–34.0)
MCHC: 33.7 g/dL (ref 30.0–36.0)
MCV: 90.7 fL (ref 78.0–100.0)
MONO ABS: 0.7 10*3/uL (ref 0.1–1.0)
MONOS PCT: 8 % (ref 3–12)
Neutro Abs: 4.4 10*3/uL (ref 1.7–7.7)
Neutrophils Relative %: 49 % (ref 43–77)
Platelets: 334 10*3/uL (ref 150–400)
RBC: 4.29 MIL/uL (ref 3.87–5.11)
RDW: 13.4 % (ref 11.5–15.5)
WBC: 9 10*3/uL (ref 4.0–10.5)

## 2014-06-01 LAB — URINALYSIS, ROUTINE W REFLEX MICROSCOPIC
Bilirubin Urine: NEGATIVE
GLUCOSE, UA: NEGATIVE mg/dL
Hgb urine dipstick: NEGATIVE
Ketones, ur: NEGATIVE mg/dL
LEUKOCYTES UA: NEGATIVE
Nitrite: NEGATIVE
PROTEIN: NEGATIVE mg/dL
Specific Gravity, Urine: 1.015 (ref 1.005–1.030)
Urobilinogen, UA: 0.2 mg/dL (ref 0.0–1.0)
pH: 6 (ref 5.0–8.0)

## 2014-06-01 LAB — COMPREHENSIVE METABOLIC PANEL
ALBUMIN: 3.7 g/dL (ref 3.5–5.2)
ALT: 16 U/L (ref 0–35)
AST: 14 U/L (ref 0–37)
Alkaline Phosphatase: 50 U/L (ref 39–117)
Anion gap: 12 (ref 5–15)
BUN: 6 mg/dL (ref 6–23)
CALCIUM: 8.6 mg/dL (ref 8.4–10.5)
CO2: 25 mEq/L (ref 19–32)
Chloride: 105 mEq/L (ref 96–112)
Creatinine, Ser: 0.65 mg/dL (ref 0.50–1.10)
GFR calc Af Amer: >90 mL/min (ref >90)
GFR calc non Af Amer: >90 mL/min (ref >90)
Glucose, Bld: 91 mg/dL (ref 70–99)
Potassium: 3.6 mEq/L — ABNORMAL LOW (ref 3.7–5.3)
Sodium: 142 mEq/L (ref 137–147)
Total Bilirubin: 0.3 mg/dL (ref 0.3–1.2)
Total Protein: 6.7 g/dL (ref 6.0–8.3)

## 2014-06-01 LAB — RAPID URINE DRUG SCREEN, HOSP PERFORMED
AMPHETAMINES: POSITIVE — AB
Barbiturates: NOT DETECTED
Benzodiazepines: NOT DETECTED
Cocaine: NOT DETECTED
OPIATES: NOT DETECTED
Tetrahydrocannabinol: POSITIVE — AB

## 2014-06-01 LAB — ACETAMINOPHEN LEVEL: Acetaminophen (Tylenol), Serum: <15 ug/mL (ref 10–30)

## 2014-06-01 LAB — ETHANOL: Alcohol, Ethyl (B): <11 mg/dL (ref 0–11)

## 2014-06-01 LAB — SALICYLATE LEVEL: Salicylate Lvl: <2 mg/dL — ABNORMAL LOW (ref 2.8–20.0)

## 2014-06-01 LAB — PREGNANCY, URINE: Preg Test, Ur: NEGATIVE

## 2014-06-01 MED ORDER — LORAZEPAM 1 MG PO TABS: 1.0000 mg | ORAL_TABLET | Freq: Three times a day (TID) | ORAL | Status: DC | PRN

## 2014-06-01 MED ORDER — CLONAZEPAM 0.5 MG PO TABS
1.0000 mg | ORAL_TABLET | Freq: Two times a day (BID) | ORAL | Status: DC
  Administered 2014-06-01: 1 mg via ORAL
  Filled 2014-06-01: qty 2

## 2014-06-01 MED ORDER — ONDANSETRON HCL 4 MG PO TABS: 4.0000 mg | ORAL_TABLET | Freq: Three times a day (TID) | ORAL | Status: DC | PRN

## 2014-06-01 MED ORDER — MOMETASONE FURO-FORMOTEROL FUM 100-5 MCG/ACT IN AERO
2.0000 | INHALATION_SPRAY | Freq: Two times a day (BID) | RESPIRATORY_TRACT | Status: DC
  Administered 2014-06-01 (×2): 2 via RESPIRATORY_TRACT
  Filled 2014-06-01: qty 8.8

## 2014-06-01 MED ORDER — FLUTICASONE PROPIONATE 50 MCG/ACT NA SUSP
2.0000 | Freq: Every day | NASAL | Status: DC
  Administered 2014-06-01: 2 via NASAL
  Filled 2014-06-01: qty 16

## 2014-06-01 MED ORDER — AMPHETAMINE-DEXTROAMPHETAMINE 10 MG PO TABS
20.0000 mg | ORAL_TABLET | Freq: Two times a day (BID) | ORAL | Status: DC
  Administered 2014-06-01: 20 mg via ORAL
  Filled 2014-06-01: qty 2

## 2014-06-01 MED ORDER — ZOLPIDEM TARTRATE 5 MG PO TABS: 5.0000 mg | ORAL_TABLET | Freq: Every evening | ORAL | Status: DC | PRN

## 2014-06-01 MED ORDER — NICOTINE 21 MG/24HR TD PT24: 21.0000 mg | MEDICATED_PATCH | Freq: Every day | TRANSDERMAL | Status: DC

## 2014-06-01 MED ORDER — ALBUTEROL SULFATE HFA 108 (90 BASE) MCG/ACT IN AERS: 2.0000 | INHALATION_SPRAY | Freq: Four times a day (QID) | RESPIRATORY_TRACT | Status: DC | PRN

## 2014-06-01 MED ORDER — ACETAMINOPHEN 325 MG PO TABS: 650.0000 mg | ORAL_TABLET | ORAL | Status: DC | PRN

## 2014-06-01 MED ORDER — VENLAFAXINE HCL ER 75 MG PO CP24
225.0000 mg | ORAL_CAPSULE | Freq: Every day | ORAL | Status: DC
  Administered 2014-06-01: 225 mg via ORAL
  Filled 2014-06-01 (×2): qty 1

## 2014-06-01 MED ORDER — IBUPROFEN 400 MG PO TABS: 600.0000 mg | ORAL_TABLET | Freq: Three times a day (TID) | ORAL | Status: DC | PRN

## 2014-06-01 MED ORDER — MONTELUKAST SODIUM 10 MG PO TABS
10.0000 mg | ORAL_TABLET | Freq: Every day | ORAL | Status: DC
  Administered 2014-06-01: 10 mg via ORAL
  Filled 2014-06-01 (×2): qty 1

## 2014-06-01 NOTE — BH Assessment (Addendum)
Reviewed ED notes prior to assessment, per documentation pt is medically cleared for assessment. Hx of PTSD presenting to ED due to anxiety today.   Requested cart be placed with pt for assessment. Per RN pt is moving to POD C and can be assessed in about 10 minutes.   Assessment to commence at 0125.  0125 Pt is still in transport, ED staff will ring cart when pt is ready for assessment.      Clista BernhardtNancy Taneah Masri, [facility] Triage Specialist 06/01/2014 1:12 AM
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
## 2014-06-01 NOTE — Discharge Instructions (Signed)
°Depression °Depression refers to feeling sad, low, down in the dumps, blue, gloomy, or empty. In general, there are two kinds of depression: °1. Normal sadness or normal grief. This kind of depression is one that we all feel from time to time after upsetting life experiences, such as the loss of a job or the ending of a relationship. This kind of depression is considered normal, is short lived, and resolves within a few days to 2 weeks. Depression experienced after the loss of a loved one (bereavement) often lasts longer than 2 weeks but normally gets better with time. °2. Clinical depression. This kind of depression lasts longer than normal sadness or normal grief or interferes with your ability to function at home, at work, and in school. It also interferes with your personal relationships. It affects almost every aspect of your life. Clinical depression is an illness. °Symptoms of depression can also be caused by conditions other than those mentioned above, such as: °· Physical illness. Some physical illnesses, including underactive thyroid gland (hypothyroidism), severe anemia, specific types of cancer, diabetes, uncontrolled seizures, heart and lung problems, strokes, and chronic pain are commonly associated with symptoms of depression. °· Side effects of some prescription medicine. In some people, certain types of medicine can cause symptoms of depression. °· Substance abuse. Abuse of alcohol and illicit drugs can cause symptoms of depression. °SYMPTOMS °Symptoms of normal sadness and normal grief include the following: °· Feeling sad or crying for short periods of time. °· Not caring about anything (apathy). °· Difficulty sleeping or sleeping too much. °· No longer able to enjoy the things you used to enjoy. °· Desire to be by oneself all the time (social isolation). °· Lack of energy or motivation. °· Difficulty concentrating or remembering. °· Change in appetite or weight. °· Restlessness or  agitation. °Symptoms of clinical depression include the same symptoms of normal sadness or normal grief and also the following symptoms: °· Feeling sad or crying all the time. °· Feelings of guilt or worthlessness. °· Feelings of hopelessness or helplessness. °· Thoughts of suicide or the desire to harm yourself (suicidal ideation). °· Loss of touch with reality (psychotic symptoms). Seeing or hearing things that are not real (hallucinations) or having false beliefs about your life or the people around you (delusions and paranoia). °DIAGNOSIS  °The diagnosis of clinical depression is usually based on how bad the symptoms are and how long they have lasted. Your health care provider will also ask you questions about your medical history and substance use to find out if physical illness, use of prescription medicine, or substance abuse is causing your depression. Your health care provider may also order blood tests. °TREATMENT  °Often, normal sadness and normal grief do not require treatment. However, sometimes antidepressant medicine is given for bereavement to ease the depressive symptoms until they resolve. °The treatment for clinical depression depends on how bad the symptoms are but often includes antidepressant medicine, counseling with a mental health professional, or both. Your health care provider will help to determine what treatment is best for you. °Depression caused by physical illness usually goes away with appropriate medical treatment of the illness. If prescription medicine is causing depression, talk with your health care provider about stopping the medicine, decreasing the dose, or changing to another medicine. °Depression caused by the abuse of alcohol or illicit drugs goes away when you stop using these substances. Some adults need professional help in order to stop drinking or using drugs. °SEEK IMMEDIATE MEDICAL   CARE IF: °· You have thoughts about hurting yourself or others. °· You lose touch  with reality (have psychotic symptoms). °· You are taking medicine for depression and have a serious side effect. °FOR MORE INFORMATION °· National Alliance on Mental Illness: www.nami.org  °· National Institute of Mental Health: www.nimh.nih.gov  °Document Released: 07/17/2000 Document Revised: 12/04/2013 Document Reviewed: 10/19/2011 °ExitCare® Patient Information ©2015 [employer]. This information is not intended to replace advice given to you by your health care provider. Make sure you discuss any questions you have with your health care provider. °  Emergency Department Resource Guide °1) Find a Doctor and Pay Out of Pocket °Although you won't have to find out who is covered by your insurance plan, it is a good idea to ask around and get recommendations. You will then need to call the office and see if the doctor you have chosen will accept you as a new patient and what types of options they offer for patients who are self-pay. Some doctors offer discounts or will set up payment plans for their patients who do not have insurance, but you will need to ask so you aren't surprised when you get to your appointment. ° °2) Contact Your Local Health Department °Not all health departments have doctors that can see patients for sick visits, but many do, so it is worth a call to see if yours does. If you don't know where your local health department is, you can check in your phone book. The CDC also has a tool to help you locate your state's health department, and many state websites also have listings of all of their local health departments. ° °3) Find a Walk-in Clinic °If your illness is not likely to be very severe or complicated, you may want to try a walk in clinic. These are popping up all over the country in pharmacies, drugstores, and shopping centers. They're usually staffed by nurse practitioners or physician assistants that have been trained to treat common illnesses and complaints. They're usually fairly  quick and inexpensive. However, if you have serious medical issues or chronic medical problems, these are probably not your best option. ° °No Primary Care Doctor: °- Call [facility] at  832-8000 - they can help you locate a primary care doctor that  accepts your insurance, provides certain services, etc. °- Physician Referral Service- 1-800-533-3463 ° °Chronic Pain Problems: °Organization         Address  Phone   Notes  °[facility]  (336) 297-2271 Patients need to be referred by their primary care doctor.  ° °Medication Assistance: °Organization         Address  Phone   Notes  °[place] [facility] [address] °[place], [place] [address] (336) 641-8030 --Must be a resident of [place] °-- Must have NO insurance coverage whatsoever (no Medicaid/ Medicare, etc.) °-- The pt. MUST have a primary care doctor that directs their care regularly and follows them in the community °  °[facility]  (866) 331-1348   °[employer]  (888) 892-1162   ° °Agencies that provide inexpensive medical care: °Organization         Address  Phone   Notes  °[facility]  (336) 832-8035   °[facility]    (336) 832-7272   °[facility] [address] °[place], [place] [address] (336) 832-4777   °[facility] [address], °[place] (336) 271-4999   °  [facility]    (336) 373-0678   °[facility]    (336) 272-1050   °[facility] ° [address], [place] Phone:  (336) 832-4444, Fax:  (336) 832-4440 Hours of Operation:  9 am - 6 pm, M-F.  Also accepts Medicaid/Medicare and self-pay.  °[facility] ° [address], [place] Phone: (336) 832-3150, Fax: (336) 832-3151. Hours of Operation:  8:30 am - 5:30 pm, M-F.  Also accepts Medicaid and self-pay.  °[facility] [place] [address], [place] Phone: (336) 878-6027    °[facility] [address], [place], [place] (336)723-1848, Ext. 123 Mondays & Thursdays: 7-9 AM.  First 15 patients are seen on a first come, first serve basis. °  ° °Medicaid-accepting [place] Providers: ° °Organization         Address  Phone   Notes  °[facility] [address], [place] (336) 641-2100 Also accepts self-pay patients.  °[facility] [address], [place] ° (336) 856-9996   °[facility] [address], [place] (336) 288-8857   °[facility] [address] [place] [address], [place] (336) 299-7000   °Veita Bland [address], [place]  ° (336) 373-1557 Only accepts [place] Access Medicaid patients after they have their name applied to their card.  ° °Self-Pay (no insurance) in [place]: ° °Organization         Address  Phone   Notes  °Sickle Cell Patients, [facility] [address], [place] (336) 832-1970   °[facility] [address], [place] (336) 832-4400   °[facility] ° [address], [place] (336) 992-4800   °[facility]/Dr. Osei-Bonsu ° [address] [place] [address], [place] or [address], [place] (336) 841-8500 Phone number for both [place] and [place] locations is the same.  °[facility] [address], [place] (336) 299-0000   °[facility] [address] [place] [address], [place] or [address] (336) 852-7530 °(336) 878-2260   °[facility] [address], [place] (336) 350-1642, phone; (336) 294-5005, fax Sees patients 1st and 3rd Saturday of every month.  Must not qualify for public or private insurance (i.e. Medicaid, Medicare, [place] Health Choice, Veterans' Benefits) • Household income should be no more than 200% of the poverty level •The clinic cannot treat you if you are  pregnant or think you are pregnant • Sexually transmitted diseases are not treated at the clinic.  ° °Dental Care: °Organization         Address  Phone  Notes  °[facility] [address], [place] (336) 641-6152 Accepts children up to age 21 who are enrolled in Medicaid or [place] Health Choice; pregnant women with a Medicaid card; and children who have applied for Medicaid or [place] Health Choice, but were declined, whose parents can pay a reduced fee at time of service.  °[facility] [place]  [address], [place] (336) 641-7733 Accepts children up to age 21 who are enrolled in Medicaid or [place] Health Choice; pregnant women with a Medicaid card; and children who have applied for Medicaid or  Health Choice, but were declined, whose   parents can pay a reduced fee at time of service.  °[facility] ° [address], [place] (336) 641-4533 Patients are seen by appointment only. Walk-ins are not accepted. [facility] will see patients 18 years of age and older. °Monday - Tuesday (8am-5pm) °Most Wednesdays (8:30-5pm) °$30 per visit, cash only  °[facility] ° [address], [place] (336) 641-4533 Patients are seen by appointment only. Walk-ins are not accepted. [facility] will see patients 18 years of age and older. °One Wednesday Evening (Monthly: Volunteer Based).  $30 per visit, cash only  °[facility]  (919) 537-3737 for adults; Children under age 4, call Graduate Pediatric Dentistry at (919) [phone]. Children aged 4-14, please call (919) [phone] to request a pediatric application. ° Dental services are provided in all areas of dental care including fillings, crowns and bridges, complete and partial dentures, implants, gum treatment, root canals, and extractions. Preventive care is also provided. Treatment is provided to  both adults and children. °Patients are selected via a lottery and there is often a waiting list. °  °[facility] [address], °[place] ° (336) 763-8833 www.drcivils.com °  °[facility] [address], [place], [place] (336)723-1848, Ext. 123 Second and Fourth Thursday of each month, opens at 6:30 AM; Clinic ends at 9 AM.  Patients are seen on a first-come first-served basis, and a limited number are seen during each clinic.  ° °[facility] ° [address], [place], [place] (336) 723-7904   Eligibility Requirements °You must have lived in [place], or [place] counties for at least the last three months. °  You cannot be eligible for state or federal sponsored healthcare insurance, including Veterans Administration, Medicaid, or Medicare. °  You generally cannot be eligible for healthcare insurance through your employer.  °  How to apply: °Eligibility screenings are held every Tuesday and Wednesday afternoon from 1:00 pm until 4:00 pm. You do not need an appointment for the interview!  °[facility] [address], [place], [place] 336-631-2330   °[facility]  336-342-8273   °[facility]  336-703-3100   °[facility]  336-570-6415   ° °Behavioral Health Resources in the Community: °Intensive Outpatient Programs °Organization         Address  Phone  Notes  °[facility] [address], [place], [place] 336-878-6098   °[facility] [address], [place], [place] 336-832-9800   °[facility] [address], [place], [place] ° 336-882-2125   °[facility] [address],  °[place],  1-800-853-5163 or 336-641-4981   °Substance Abuse Resources °Organization         Address  Phone  Notes  °[facility]  336-882-2125   °[facility]  336-784-9470   °[facility]   336-285-9073   °[facility]  336-845-3988   °Residential & Outpatient Substance Abuse Program  1-800-659-3381   °Psychological Services °Organization         Address  Phone  Notes  °[facility]  336- 832-9600   °[facility]  336- 378-7881   °[facility] [address],  1-800-853-5163 or 336-641-4981   ° °Mobile Crisis Teams °Organization         Address  Phone  Notes  °[facility], Mobile Crisis   Care Unit  1-877-626-1772  °Assertive °[facility] ° [address] [place], [place] 336-834-9664  °Sharon DeEsch [address] °[place] 336-554-5454  ° °Self-Help/Support Groups °Organization         Address  Phone             Notes  °[facility] - variety of support groups  336- 373-1402 Call for more information  °Narcotics Anonymous (NA), [facility] [address], °[place]  2 meetings at this location  ° °Residential Treatment Programs °Organization         Address  Phone  Notes  °[facility] [address],    °[place]  1-866-801-8205   °[facility] ° [address], [place], [place] 704-293-8524   °[facility] [address], [place] 336-845-3988 Admissions: 8am-3pm M-F  °[facility] [address]    °[place], [place] 336-841-1104   °[facility] [address], [place], [place] 336-379-7146   °[facility] [address]  °[place], [place] 336-285-9073   °[facility] - Intensive Outpatient [address], , [place] 336-852-3033   °[facility] [address]  °[place], [place] 1-877-615-2722 or 336-784-9470   °[facility] [address] [place], [place] 336-227-7417 Accepts Medicaid  °[facility] [address]  ° [place] 1-800-659-3381 Substance Abuse/Addiction Treatment  ° °[place] Behavioral Health Resources °Organization         Address  Phone  Notes  °[facility]  (888) 581-9988   °Julie Brannon, PhD [address] [place], [place]   (336) 349-5553 or (336) 951-0000   °[facility]   [address] °[place], [place] (336) 349-4454   °[facility] [address], [place], [place] (336) 342-8316 Insurance/Medicaid/sponsorship through [facility]  °[facility] [address]                                    [place], [place] (336) 342-8316 Therapy/tele-psych/case  °[facility] [address]  ° [place], [place] (336) 349-2233    °Dr. Arfeen  (336) 349-4544   °[facility] [place]  [employer] [place] [facility] 1) [address], [place] °2) [address], [place] °3)  [address], [place] (336) 349-3220 °(336) 342-7768 ° °(336) 342-8140   °[place] [facility] (336) 342-1394 or (336) 342-3537 (After Hours)    ° °   °

## 2014-06-01 NOTE — ED Provider Notes (Signed)
Medical screening examination/treatment/procedure(s) were performed by non-physician practitioner and as supervising physician I was immediately available for consultation/collaboration.   EKG Interpretation None      Patient evaluated by TTS and does not meet inpatient criteria.  Outpatient resources given.  Shon Batonourtney F Kinslie Hove, MD 06/01/14 [phone]

## 2014-06-01 NOTE — BH Assessment (Signed)
Relayed results of assessment to Janann Augustori Burkett, NP. Per NP pt does not meet inpt criteria and can be discharged with OP resources.   Informed EDP, Dr. Wilkie AyeHorton, of recommendations and she is in agreement.  Informed RN and faxed OP referrals. Pt encouraged to follow up with PCP about medications, initiate OP counseling and call Dr. Evelene CroonKaur to see if she can have her scheduled appointment moved up. Pt should consider seeking psychiatric services with another provider if appointment can not be sooner than February as scheduled currently.    Clista BernhardtNancy Ronit Marczak, [facility] Triage Specialist 06/01/2014 2:08 AM

## 2014-06-01 NOTE — ED Notes (Signed)
Pt. Had no belongings.  Pt.s significant other brought her clothes

## 2014-06-01 NOTE — ED Notes (Signed)
TTS in process 

## 2014-07-11 ENCOUNTER — Encounter (HOSPITAL_COMMUNITY): Payer: Self-pay | Admitting: Adult Health

## 2014-07-11 ENCOUNTER — Emergency Department (HOSPITAL_COMMUNITY)
Admission: EM | Admit: 2014-07-11 | Discharge: 2014-07-12 | Disposition: A | Discharge status: Home / Self Care | Payer: BC Managed Care – PPO | Attending: Emergency Medicine | Admitting: Emergency Medicine

## 2014-07-11 DIAGNOSIS — J45909 Unspecified asthma, uncomplicated: Secondary | ICD-10-CM | POA: Insufficient documentation

## 2014-07-11 DIAGNOSIS — M79606 Pain in leg, unspecified: Secondary | ICD-10-CM

## 2014-07-11 DIAGNOSIS — R2241 Localized swelling, mass and lump, right lower limb: Secondary | ICD-10-CM | POA: Diagnosis present

## 2014-07-11 DIAGNOSIS — Z7951 Long term (current) use of inhaled steroids: Secondary | ICD-10-CM | POA: Diagnosis not present

## 2014-07-11 DIAGNOSIS — Z720 Tobacco use: Secondary | ICD-10-CM | POA: Insufficient documentation

## 2014-07-11 DIAGNOSIS — F329 Major depressive disorder, single episode, unspecified: Secondary | ICD-10-CM | POA: Diagnosis not present

## 2014-07-11 DIAGNOSIS — Z79899 Other long term (current) drug therapy: Secondary | ICD-10-CM | POA: Insufficient documentation

## 2014-07-11 DIAGNOSIS — M79604 Pain in right leg: Secondary | ICD-10-CM | POA: Diagnosis not present

## 2014-07-11 DIAGNOSIS — F419 Anxiety disorder, unspecified: Secondary | ICD-10-CM | POA: Diagnosis not present

## 2014-07-11 DIAGNOSIS — M79605 Pain in left leg: Secondary | ICD-10-CM | POA: Diagnosis not present

## 2014-07-11 DIAGNOSIS — Z880 Allergy status to penicillin: Secondary | ICD-10-CM | POA: Insufficient documentation

## 2014-07-11 DIAGNOSIS — Z8742 Personal history of other diseases of the female genital tract: Secondary | ICD-10-CM | POA: Insufficient documentation

## 2014-07-11 LAB — I-STAT CHEM 8, ED
BUN: 9 mg/dL (ref 6–23)
Calcium, Ion: 1.14 mmol/L (ref 1.12–1.23)
Chloride: 101 mEq/L (ref 96–112)
Creatinine, Ser: 0.7 mg/dL (ref 0.50–1.10)
Glucose, Bld: 91 mg/dL (ref 70–99)
HEMATOCRIT: 40 % (ref 36.0–46.0)
Hemoglobin: 13.6 g/dL (ref 12.0–15.0)
Potassium: 4 mEq/L (ref 3.7–5.3)
SODIUM: 138 meq/L (ref 137–147)
TCO2: 24 mmol/L (ref 0–100)

## 2014-07-11 MED ORDER — TRAMADOL HCL 50 MG PO TABS
50.0000 mg | ORAL_TABLET | Freq: Once | ORAL | Status: AC
  Administered 2014-07-11: 50 mg via ORAL
  Filled 2014-07-11: qty 1

## 2014-07-11 NOTE — ED Notes (Signed)
Presents with bilateral upper and lower extremity edema began last night after work and has not improved. Pt reports pain and numbness and tingling in bilateral feet.

## 2014-07-11 NOTE — ED Provider Notes (Signed)
CSN: [id number]     Arrival date & time 07/11/14  2237 History   First MD Initiated Contact with Patient 07/11/14 2258     Chief Complaint  Patient presents with  . Leg Swelling     (Consider location/radiation/quality/duration/timing/severity/associated sxs/prior Treatment) HPI Angie [name] is a 33 y.o. female history of depression, asthma, comes in for evaluation of swelling in her feet. 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 She was concerned because the swelling had not gone away today. She denies any chest pain, shortness of breath, numbness or weakness, unilateral leg swelling. She denies any recent travel, surgeries, blood clots, cough. There are no other modifying factors  Past Medical History  Diagnosis Date  . Dizziness   . Asthma   . Chronic bronchitis   . Endometriosis   . Anxiety   . Morbid obesity with BMI of 40.0-44.9, adult   . Abdominal pain   . DEPRESSION 09/20/2007    Qualifier: Diagnosis of  By: Jillyn Hidden FNP, Mcarthur Rossetti    Past Surgical History  Procedure Laterality Date  . Total abdominal hysterectomy  May 2012    Right ovary still in  . Cholecystectomy  June 2011  . Cesarean section    . Salpingectomy  November 2004    Left sided, with adhesion lysis  . Tilt table study N/A 07/18/2012    Procedure: TILT TABLE STUDY;  Surgeon: Duke Salvia, MD;  Location: [facility] CATH LAB;  Service: Cardiovascular;  Laterality: N/A;   Family History  Problem Relation Age of Onset  . Cancer Mother     colon cancer  . Heart failure Father     4 vessel bypass   History  Substance Use Topics  . Smoking status: Current Every Day Smoker -- 1.00 packs/day for 15 years    Types: Cigarettes  . Smokeless tobacco: Not on file  . Alcohol Use: No   OB History    No data available     Review of Systems  Constitutional: Negative for fever.  HENT: Negative for sore throat.    Eyes: Negative for visual disturbance.  Respiratory: Negative for shortness of breath.   Cardiovascular: Positive for leg swelling. Negative for chest pain.  Gastrointestinal: Negative for abdominal pain.  Endocrine: Negative for polyuria.  Genitourinary: Negative for dysuria.  Musculoskeletal:       Lower leg pain  Skin: Negative for rash.  Neurological: Negative for headaches.      Allergies  Amoxicillin-pot clavulanate; Penicillins; Sulfa antibiotics; Cephalosporins; and Lexapro  Home Medications   Prior to Admission medications   Medication Sig Start Date End Date Taking? Authorizing Provider  albuterol (PROVENTIL HFA;VENTOLIN HFA) 108 (90 BASE) MCG/ACT inhaler Inhale 2 puffs into the lungs every 6 (six) hours as needed. For shortness of breath    Historical Provider, MD  amphetamine-dextroamphetamine (ADDERALL) 20 MG tablet Take 20 mg by mouth 2 (two) times daily.    Historical Provider, MD  clonazePAM (KLONOPIN) 1 MG tablet Take 1 mg by mouth 2 (two) times daily.    Historical Provider, MD  cyclobenzaprine (FLEXERIL) 10 MG tablet Take 10 mg by mouth 3 (three) times daily as needed for muscle spasms.    Historical Provider, MD  EPINEPHrine (EPIPEN) 0.3 mg/0.3 mL DEVI Inject 0.3 mg into the muscle once.    Historical Provider, MD  fluticasone (FLONASE) 50 MCG/ACT nasal spray Place 2 sprays into  both nostrils daily.    Historical Provider, MD  Fluticasone-Salmeterol (ADVAIR) 250-50 MCG/DOSE AEPB Inhale 1 puff into the lungs 2 (two) times daily.    Historical Provider, MD  ibuprofen (ADVIL,MOTRIN) 800 MG tablet Take 800 mg by mouth every 8 (eight) hours as needed. For pain    Historical Provider, MD  montelukast (SINGULAIR) 10 MG tablet Take 10 mg by mouth at bedtime.    Historical Provider, MD  naproxen (NAPROSYN) 500 MG tablet Take 1 tablet (500 mg total) by mouth 2 (two) times daily. 07/12/14   Earle GellBenjamin W Lachae Hohler, PA-C  traMADol (ULTRAM) 50 MG tablet Take 1 tablet (50 mg total)  by mouth every 6 (six) hours as needed. 07/12/14   Earle GellBenjamin W Edwyna Dangerfield, PA-C  venlafaxine XR (EFFEXOR-XR) 150 MG 24 hr capsule Take 225 mg by mouth at bedtime.     Historical Provider, MD  zolpidem (AMBIEN) 10 MG tablet Take 10 mg by mouth at bedtime as needed for sleep.    Historical Provider, MD   BP 96/51 mmHg  Pulse 94  Temp(Src) 98.5 F (36.9 C) (Oral)  Resp 16  SpO2 96%  LMP 09/19/2010 Physical Exam  Constitutional: She is oriented to person, place, and time. She appears well-developed and well-nourished.  Morbidly obese  HENT:  Head: Normocephalic and atraumatic.  Mouth/Throat: Oropharynx is clear and moist.  Very poor dentition. Moist mucous membranes no apparent exudate.  Eyes: Conjunctivae are normal. Pupils are equal, round, and reactive to light. Right eye exhibits no discharge. Left eye exhibits no discharge. No scleral icterus.  Neck: Neck supple. No tracheal deviation present.  Cardiovascular: Normal rate, regular rhythm and normal heart sounds.  Exam reveals no gallop and no friction rub.   No murmur heard. Pulmonary/Chest: Effort normal and breath sounds normal. No respiratory distress. She has no wheezes. She has no rales.  Abdominal: Soft. There is no tenderness.  Musculoskeletal: She exhibits no tenderness.  Upon dorsiflexion of both feet, patient feels pain in the bottom of foot and Achilles tendon. No pain in calf. No erythema, overt swelling. Compartments soft, no muscle belly tenderness. No evidence of vascular compromise. Distal pulses intact, neurovascularly intact.   Neurological: She is alert and oriented to person, place, and time.  Cranial Nerves II-XII grossly intact  Skin: Skin is warm and dry. No rash noted.  Psychiatric: She has a normal mood and affect.  Nursing note and vitals reviewed.   ED Course  Procedures (including critical care time) Labs Review Labs Reviewed  I-STAT CHEM 8, ED    Imaging Review No results found.   EKG  Interpretation None     Meds given in ED:  Medications  traMADol (ULTRAM) tablet 50 mg (50 mg Oral Given 07/11/14 2324)  HYDROcodone-acetaminophen (NORCO/VICODIN) 5-325 MG per tablet 2 tablet (2 tablets Oral Given 07/12/14 0102)    Discharge Medication List as of 07/12/2014 12:54 AM    START taking these medications   Details  naproxen (NAPROSYN) 500 MG tablet Take 1 tablet (500 mg total) by mouth 2 (two) times daily., Starting 07/12/2014, Until Discontinued, Print    traMADol (ULTRAM) 50 MG tablet Take 1 tablet (50 mg total) by mouth every 6 (six) hours as needed., Starting 07/12/2014, Until Discontinued, Print       Filed Vitals:   07/11/14 2248 07/11/14 2315 07/12/14 0038 07/12/14 0100  BP: 110/64 105/59 98/60 96/51   Pulse: 106 92 99 94  Temp: 98.5 F (36.9 C)     TempSrc: Oral  Resp: 16     SpO2: 100% 99% 99% 96%    MDM  Vitals stable - WNL -afebrile Pt resting comfortably in ED. PE--not concerning further acute or emergent pathology. No erythema, overt swelling, or tenderness to muscle bellies. Labwork noncontributory  DDX--patient with tenderness of Achilles tendon with ranging and dorsiflexion of ankles. Likely tendonitis. Low concern for necrotizing fasciitis, compartment syndrome, DVT (Well's score -2), no evidence of vascular compromise. Neurovascularly intact. Ambulates independently out of the ED without any apparent ataxia Will DC with pain medicine and NSAIDs Discussed f/u with PCP and return precautions, pt very amenable to plan.   Final diagnoses:  Pain of lower extremity, unspecified laterality        Sharlene MottsBenjamin W Dorlisa Savino, PA-C 07/12/14 1231  Tomasita CrumbleAdeleke Oni, MD 07/12/14 1550

## 2014-07-12 ENCOUNTER — Encounter (HOSPITAL_COMMUNITY): Payer: Self-pay | Admitting: Internal Medicine

## 2014-07-12 MED ORDER — TRAMADOL HCL 50 MG PO TABS: 50.0000 mg | ORAL_TABLET | Freq: Four times a day (QID) | ORAL | Status: DC | PRN

## 2014-07-12 MED ORDER — HYDROCODONE-ACETAMINOPHEN 5-325 MG PO TABS
2.0000 | ORAL_TABLET | Freq: Once | ORAL | Status: AC
Start: 2014-07-12 — End: 2014-07-12
  Administered 2014-07-12: 2 via ORAL
  Filled 2014-07-12: qty 2

## 2014-07-12 MED ORDER — NAPROXEN 500 MG PO TABS: 500.0000 mg | ORAL_TABLET | Freq: Two times a day (BID) | ORAL | Status: DC

## 2014-07-12 NOTE — Discharge Instructions (Signed)
You were evaluated in the ED for your leg pain. There does not appear to be an emergent cause for her symptoms at this time. Please return to ED if you begin to experience worsening symptoms, redness around her legs, increased swelling, numbness or weakness. Please follow-up with primary care for further evaluation and management of your symptoms. You may take the naproxen and tramadol as needed for any discomfort he may experience

## 2014-11-29 ENCOUNTER — Encounter (HOSPITAL_COMMUNITY): Payer: Self-pay | Admitting: *Deleted

## 2014-11-29 ENCOUNTER — Emergency Department (HOSPITAL_COMMUNITY)
Admission: EM | Admit: 2014-11-29 | Discharge: 2014-11-29 | Disposition: A | Discharge status: Home / Self Care | Payer: BLUE CROSS/BLUE SHIELD | Attending: Emergency Medicine | Admitting: Emergency Medicine

## 2014-11-29 ENCOUNTER — Emergency Department (INDEPENDENT_AMBULATORY_CARE_PROVIDER_SITE_OTHER)
Admission: EM | Admit: 2014-11-29 | Discharge: 2014-11-29 | Disposition: A | Discharge status: Home / Self Care | Payer: BLUE CROSS/BLUE SHIELD | Source: Home / Self Care | Attending: Emergency Medicine | Admitting: Emergency Medicine

## 2014-11-29 ENCOUNTER — Emergency Department (HOSPITAL_COMMUNITY): Payer: BLUE CROSS/BLUE SHIELD

## 2014-11-29 DIAGNOSIS — Z79899 Other long term (current) drug therapy: Secondary | ICD-10-CM | POA: Insufficient documentation

## 2014-11-29 DIAGNOSIS — Z789 Other specified health status: Secondary | ICD-10-CM | POA: Diagnosis not present

## 2014-11-29 DIAGNOSIS — J040 Acute laryngitis: Secondary | ICD-10-CM

## 2014-11-29 DIAGNOSIS — J4542 Moderate persistent asthma with status asthmaticus: Secondary | ICD-10-CM | POA: Diagnosis not present

## 2014-11-29 DIAGNOSIS — Z720 Tobacco use: Secondary | ICD-10-CM | POA: Insufficient documentation

## 2014-11-29 DIAGNOSIS — J45901 Unspecified asthma with (acute) exacerbation: Secondary | ICD-10-CM | POA: Insufficient documentation

## 2014-11-29 DIAGNOSIS — F329 Major depressive disorder, single episode, unspecified: Secondary | ICD-10-CM | POA: Diagnosis not present

## 2014-11-29 DIAGNOSIS — Z7951 Long term (current) use of inhaled steroids: Secondary | ICD-10-CM | POA: Diagnosis not present

## 2014-11-29 DIAGNOSIS — Z87448 Personal history of other diseases of urinary system: Secondary | ICD-10-CM | POA: Insufficient documentation

## 2014-11-29 DIAGNOSIS — F419 Anxiety disorder, unspecified: Secondary | ICD-10-CM | POA: Diagnosis not present

## 2014-11-29 DIAGNOSIS — Z880 Allergy status to penicillin: Secondary | ICD-10-CM | POA: Insufficient documentation

## 2014-11-29 DIAGNOSIS — R0602 Shortness of breath: Secondary | ICD-10-CM | POA: Diagnosis present

## 2014-11-29 LAB — BASIC METABOLIC PANEL
ANION GAP: 9 (ref 5–15)
BUN: 9 mg/dL (ref 6–23)
CHLORIDE: 102 mmol/L (ref 96–112)
CO2: 26 mmol/L (ref 19–32)
Calcium: 9.3 mg/dL (ref 8.4–10.5)
Creatinine, Ser: 0.74 mg/dL (ref 0.50–1.10)
Glucose, Bld: 95 mg/dL (ref 70–99)
POTASSIUM: 3.7 mmol/L (ref 3.5–5.1)
Sodium: 137 mmol/L (ref 135–145)

## 2014-11-29 LAB — CBC
HEMATOCRIT: 39.3 % (ref 36.0–46.0)
Hemoglobin: 13.4 g/dL (ref 12.0–15.0)
MCH: 30.9 pg (ref 26.0–34.0)
MCHC: 34.1 g/dL (ref 30.0–36.0)
MCV: 90.6 fL (ref 78.0–100.0)
Platelets: 354 10*3/uL (ref 150–400)
RBC: 4.34 MIL/uL (ref 3.87–5.11)
RDW: 14.3 % (ref 11.5–15.5)
WBC: 15 10*3/uL — ABNORMAL HIGH (ref 4.0–10.5)

## 2014-11-29 MED ORDER — HYDROCOD POLST-CPM POLST ER 10-8 MG/5ML PO SUER: 5.0000 mL | Freq: Two times a day (BID) | ORAL | Status: DC | PRN

## 2014-11-29 MED ORDER — ALBUTEROL SULFATE (2.5 MG/3ML) 0.083% IN NEBU
INHALATION_SOLUTION | RESPIRATORY_TRACT | Status: AC
  Filled 2014-11-29: qty 6

## 2014-11-29 MED ORDER — IPRATROPIUM BROMIDE 0.02 % IN SOLN
0.5000 mg | Freq: Once | RESPIRATORY_TRACT | Status: AC
  Administered 2014-11-29: 0.5 mg via RESPIRATORY_TRACT

## 2014-11-29 MED ORDER — IPRATROPIUM BROMIDE 0.02 % IN SOLN
RESPIRATORY_TRACT | Status: AC
  Filled 2014-11-29: qty 2.5

## 2014-11-29 MED ORDER — ALBUTEROL SULFATE (2.5 MG/3ML) 0.083% IN NEBU
5.0000 mg | INHALATION_SOLUTION | Freq: Once | RESPIRATORY_TRACT | Status: AC
  Administered 2014-11-29: 5 mg via RESPIRATORY_TRACT
  Filled 2014-11-29: qty 6

## 2014-11-29 MED ORDER — ALBUTEROL SULFATE (2.5 MG/3ML) 0.083% IN NEBU
5.0000 mg | INHALATION_SOLUTION | Freq: Once | RESPIRATORY_TRACT | Status: AC
  Administered 2014-11-29: 5 mg via RESPIRATORY_TRACT

## 2014-11-29 NOTE — ED Notes (Signed)
Pt upset and tearful; reports still feeling terrible

## 2014-11-29 NOTE — ED Notes (Signed)
Pt sent here from [facility] for status asthmaticus with failed antibiotic tx.  O2 saturation 97%.

## 2014-11-29 NOTE — ED Notes (Signed)
Patient transported to X-ray 

## 2014-11-29 NOTE — ED Notes (Signed)
Pt requesting update; Dr.Miller aware

## 2014-11-29 NOTE — ED Provider Notes (Signed)
CSN: [id number]     Arrival date & time 11/29/14  1709 History   First MD Initiated Contact with Patient 11/29/14 1827     Chief Complaint  Patient presents with  . Shortness of Breath  . Cough     (Consider location/radiation/quality/duration/timing/severity/associated sxs/prior Treatment) HPI Comments: 34 year old female with a history of lifelong asthma whose baseline medications include albuterol twice a day and Singulair.she presents from the urgent care as a transfer because of ongoing shortness of breath. She reports symptoms starting approximately the first week of April, they have been continuous over the last several weeks with only slight improvements when she takes prednisone and antibiotics. She has taken a full course of Levaquin as well as Zithromax and 2 courses of prednisone, her symptoms worsened in the last couple of days prompting an ER visit. She has been using albuterol nebulizers, meter dose inhalers, minimal improvement at home and was found to have persistent wheezing and change in the quality of her voice at the urgent care prompting a transfer. The patient states that she has had this voice change intermittently over the last month as she has had waxing and waning shortness of breath. She has a dry nonproductive cough but denies fevers chills nausea vomiting swelling diarrhea dysuria or any other complaints. She does have pain in her chest when she coughs.  Patient is a 34 y.o. female presenting with shortness of breath and cough. The history is provided by the patient and medical records.  Shortness of Breath Associated symptoms: cough   Cough Associated symptoms: shortness of breath     Past Medical History  Diagnosis Date  . Dizziness   . Asthma   . Chronic bronchitis   . Endometriosis   . Anxiety   . Morbid obesity with BMI of 40.0-44.9, adult   . Abdominal pain   . DEPRESSION 09/20/2007    Qualifier: Diagnosis of  By: Jillyn Hidden FNP, Mcarthur Rossetti     Past Surgical History  Procedure Laterality Date  . Total abdominal hysterectomy  May 2012    Right ovary still in  . Cholecystectomy  June 2011  . Cesarean section    . Salpingectomy  November 2004    Left sided, with adhesion lysis  . Tilt table study N/A 07/18/2012    Procedure: TILT TABLE STUDY;  Surgeon: Duke Salvia, MD;  Location: [facility] CATH LAB;  Service: Cardiovascular;  Laterality: N/A;   Family History  Problem Relation Age of Onset  . Cancer Mother     colon cancer  . Heart failure Father     4 vessel bypass   History  Substance Use Topics  . Smoking status: Current Every Day Smoker -- 1.00 packs/day for 15 years    Types: Cigarettes  . Smokeless tobacco: Not on file  . Alcohol Use: No   OB History    No data available     Review of Systems  Respiratory: Positive for cough and shortness of breath.   All other systems reviewed and are negative.     Allergies  Amoxicillin-pot clavulanate; Penicillins; Sulfa antibiotics; Cephalosporins; and Lexapro  Home Medications   Prior to Admission medications   Medication Sig Start Date End Date Taking? Authorizing Provider  albuterol (PROVENTIL HFA;VENTOLIN HFA) 108 (90 BASE) MCG/ACT inhaler Inhale 2 puffs into the lungs every 6 (six) hours as needed. For shortness of breath   Yes Historical Provider, MD  amphetamine-dextroamphetamine (ADDERALL XR) 30 MG 24 hr capsule Take 30 mg by  mouth daily.   Yes Historical Provider, MD  amphetamine-dextroamphetamine (ADDERALL) 30 MG tablet Take 30 mg by mouth daily.   Yes Historical Provider, MD  cyclobenzaprine (FLEXERIL) 10 MG tablet Take 10 mg by mouth 3 (three) times daily as needed for muscle spasms.   Yes Historical Provider, MD  diazepam (VALIUM) 10 MG tablet Take 10 mg by mouth 3 (three) times daily as needed for anxiety.   Yes Historical Provider, MD  EPINEPHrine (EPIPEN) 0.3 mg/0.3 mL DEVI Inject 0.3 mg into the muscle once.   Yes Historical Provider, MD  fluticasone  (FLONASE) 50 MCG/ACT nasal spray Place 2 sprays into both nostrils daily.   Yes Historical Provider, MD  Fluticasone-Salmeterol (ADVAIR) 500-50 MCG/DOSE AEPB Inhale 2 puffs into the lungs 2 (two) times daily.   Yes Historical Provider, MD  ibuprofen (ADVIL,MOTRIN) 800 MG tablet Take 800 mg by mouth every 8 (eight) hours as needed. For pain   Yes Historical Provider, MD  montelukast (SINGULAIR) 10 MG tablet Take 10 mg by mouth at bedtime.   Yes Historical Provider, MD  omeprazole (PRILOSEC) 40 MG capsule Take 40 mg by mouth daily as needed. For shortness of breath   Yes Historical Provider, MD  prazosin (MINIPRESS) 5 MG capsule Take 5 mg by mouth at bedtime.   Yes Historical Provider, MD  venlafaxine XR (EFFEXOR-XR) 150 MG 24 hr capsule Take 225 mg by mouth at bedtime.    Yes Historical Provider, MD  zolpidem (AMBIEN) 10 MG tablet Take 10 mg by mouth at bedtime as needed for sleep.   Yes Historical Provider, MD  chlorpheniramine-HYDROcodone (TUSSIONEX PENNKINETIC ER) 10-8 MG/5ML SUER Take 5 mLs by mouth every 12 (twelve) hours as needed for cough. 11/29/14   Eber HongBrian Siriyah Ambrosius, MD  naproxen (NAPROSYN) 500 MG tablet Take 1 tablet (500 mg total) by mouth 2 (two) times daily. Patient not taking: Reported on 11/29/2014 07/12/14   Joycie PeekBenjamin Cartner, PA-C  traMADol (ULTRAM) 50 MG tablet Take 1 tablet (50 mg total) by mouth every 6 (six) hours as needed. Patient not taking: Reported on 11/29/2014 07/12/14   Joycie PeekBenjamin Cartner, PA-C   BP 112/86 mmHg  Pulse 93  Temp(Src) 98.1 F (36.7 C) (Oral)  Resp 20  Ht 5' (1.524 m)  Wt 250 lb (113.399 kg)  BMI 48.82 kg/m2  SpO2 96%  LMP 09/19/2010 Physical Exam  Constitutional: She appears well-developed and well-nourished. No distress.  HENT:  Head: Normocephalic and atraumatic.  Mouth/Throat: Oropharynx is clear and moist. No oropharyngeal exudate.  Oropharynx is clear and moist, no exudate asymmetry hypertrophy or erythema. Mucous membranes are moist, dentition is  poor, there is no swelling of the jaw, no tenderness over the gumlines, phonation is hoarse but the patient is tolerating secretions without any difficulties whatsoever in a recumbent position  Eyes: Conjunctivae and EOM are normal. Pupils are equal, round, and reactive to light. Right eye exhibits no discharge. Left eye exhibits no discharge. No scleral icterus.  Neck: Normal range of motion. Neck supple. No JVD present. No thyromegaly present.  No lymphadenopathy or stiffness of the neck, very supple, trachea is midline, no redness or swelling  Cardiovascular: Normal rate, regular rhythm, normal heart sounds and intact distal pulses.  Exam reveals no gallop and no friction rub.   No murmur heard. Pulse of 100, normal pulses at the radial arteries  Pulmonary/Chest: Effort normal. No respiratory distress. She has wheezes ( mild end expiratory wheezing, speaks in full sentences, breath sounds bilaterally, no rales). She has no  rales.  Abdominal: Soft. Bowel sounds are normal. She exhibits no distension and no mass. There is no tenderness.  Musculoskeletal: Normal range of motion. She exhibits no edema or tenderness.  Lymphadenopathy:    She has no cervical adenopathy.  Neurological: She is alert. Coordination normal.  Skin: Skin is warm and dry. No rash noted. No erythema.  Psychiatric: She has a normal mood and affect. Her behavior is normal.  Nursing note and vitals reviewed.   ED Course  Procedures (including critical care time) Labs Review Labs Reviewed  CBC - Abnormal; Notable for the following:    WBC 15.0 (*)    All other components within normal limits  BASIC METABOLIC PANEL    Imaging Review Dg Neck Soft Tissue  11/29/2014   CLINICAL DATA:  34 year old female with shortness of breath, cough and hoarseness  EXAM: NECK SOFT TISSUES - 1+ VIEW  COMPARISON:  Concurrently obtained chest x-ray in  FINDINGS: There is no evidence of retropharyngeal soft tissue swelling or epiglottic  enlargement. The cervical airway is unremarkable and no radio-opaque foreign body identified. Poor dentition with multiple carious teeth.  IMPRESSION: 1. No focal abnormality in the soft tissues of the neck. 2. Poor dentition with multiple carious and fractured teeth.   Electronically Signed   By: Malachy Moan M.D.   On: 11/29/2014 19:40   Dg Chest 2 View (if Patient Has Fever And/or Copd)  11/29/2014   CLINICAL DATA:  Shortness of breath with cough and hoarseness for 3 weeks. Recent steroid and antibiotic therapy. History of asthma/ chronic bronchitis. Smoker. Initial encounter.  EXAM: CHEST  2 VIEW  COMPARISON:  Radiographs 08/22/2013.  CT 12/17/2010.  FINDINGS: The heart size and mediastinal contours are stable. There is stable mild accentuation of the bronchovascular markings. No hyperinflation, confluent airspace opacity, edema or significant pleural effusion identified. The bones appear unchanged.  IMPRESSION: Stable mild chronic accentuation of the bronchovascular markings. No evidence of pneumonia.   Electronically Signed   By: Carey Bullocks M.D.   On: 11/29/2014 19:41    MDM   Final diagnoses:  Laryngitis    The patient has upper respiratory symptoms, she has no stridor, she does have hoarseness and a dry cough consistent with a bronchitis, upper respiratory infection. We'll check chest x-ray and neck soft tissue x-rays to evaluate for upper airway obstructions, vital signs unremarkable, sats 96-100% on room air with only minimal wheeze.  Her presentation is not consistent with a retropharyngeal abscess or peritonsillar abscess.  xrays neg - pt has more normal voice - counseled on not talking / coughing - out of work until Tuesday.  Meds given in ED:  Medications  albuterol (PROVENTIL) (2.5 MG/3ML) 0.083% nebulizer solution 5 mg (5 mg Nebulization Given 11/29/14 1845)    New Prescriptions   CHLORPHENIRAMINE-HYDROCODONE (TUSSIONEX PENNKINETIC ER) 10-8 MG/5ML SUER    Take 5 mLs by  mouth every 12 (twelve) hours as needed for cough.        Eber Hong, MD 11/29/14 [phone]

## 2014-11-29 NOTE — ED Provider Notes (Signed)
CSN: [id number]     Arrival date & time 11/29/14  1611 History   First MD Initiated Contact with Patient 11/29/14 1631     Chief Complaint  Patient presents with  . Shortness of Breath   (Consider location/radiation/quality/duration/timing/severity/associated sxs/prior Treatment) HPI      34 year old female who is a current 1 pack per day smoker with a history of asthma presents complaining of shortness of breath. She describes that she started feeling very short of breath approximately one month ago . This was initially treated with oral prednisone and oral Levaquin for 7 days. She did not get any better with this. About 1-1/2 weeks ago she was again started on oral steroids and oral azithromycin. Again this may no difference. She says she is getting extremely exhausted, very short of breath, and worries that this will continue to get worse. She rates her shortness of breath as a level 8 out of 10 in severity. She denies chest pain or NVD. She has been using her albuterol breathing treatments and has been using her Advair twice a day with no relief. Also voices been very hoarse.    Past Medical History  Diagnosis Date  . Dizziness   . Asthma   . Chronic bronchitis   . Endometriosis   . Anxiety   . Morbid obesity with BMI of 40.0-44.9, adult   . Abdominal pain   . DEPRESSION 09/20/2007    Qualifier: Diagnosis of  By: Jillyn Hidden FNP, Mcarthur Rossetti    Past Surgical History  Procedure Laterality Date  . Total abdominal hysterectomy  May 2012    Right ovary still in  . Cholecystectomy  June 2011  . Cesarean section    . Salpingectomy  November 2004    Left sided, with adhesion lysis  . Tilt table study N/A 07/18/2012    Procedure: TILT TABLE STUDY;  Surgeon: Duke Salvia, MD;  Location: [facility] CATH LAB;  Service: Cardiovascular;  Laterality: N/A;   Family History  Problem Relation Age of Onset  . Cancer Mother     colon cancer  . Heart failure Father     4 vessel bypass   History   Substance Use Topics  . Smoking status: Current Every Day Smoker -- 1.00 packs/day for 15 years    Types: Cigarettes  . Smokeless tobacco: Not on file  . Alcohol Use: No   OB History    No data available     Review of Systems  Constitutional: Positive for fatigue.  HENT: Positive for voice change.   Respiratory: Positive for cough and shortness of breath.   All other systems reviewed and are negative.   Allergies  Amoxicillin-pot clavulanate; Penicillins; Sulfa antibiotics; Cephalosporins; and Lexapro  Home Medications   Prior to Admission medications   Medication Sig Start Date End Date Taking? Authorizing Provider  albuterol (PROVENTIL HFA;VENTOLIN HFA) 108 (90 BASE) MCG/ACT inhaler Inhale 2 puffs into the lungs every 6 (six) hours as needed. For shortness of breath    Historical Provider, MD  amphetamine-dextroamphetamine (ADDERALL) 20 MG tablet Take 20 mg by mouth 2 (two) times daily.    Historical Provider, MD  clonazePAM (KLONOPIN) 1 MG tablet Take 1 mg by mouth 2 (two) times daily.    Historical Provider, MD  cyclobenzaprine (FLEXERIL) 10 MG tablet Take 10 mg by mouth 3 (three) times daily as needed for muscle spasms.    Historical Provider, MD  EPINEPHrine (EPIPEN) 0.3 mg/0.3 mL DEVI Inject 0.3 mg into the muscle  once.    Historical Provider, MD  fluticasone (FLONASE) 50 MCG/ACT nasal spray Place 2 sprays into both nostrils daily.    Historical Provider, MD  Fluticasone-Salmeterol (ADVAIR) 250-50 MCG/DOSE AEPB Inhale 1 puff into the lungs 2 (two) times daily.    Historical Provider, MD  ibuprofen (ADVIL,MOTRIN) 800 MG tablet Take 800 mg by mouth every 8 (eight) hours as needed. For pain    Historical Provider, MD  montelukast (SINGULAIR) 10 MG tablet Take 10 mg by mouth at bedtime.    Historical Provider, MD  naproxen (NAPROSYN) 500 MG tablet Take 1 tablet (500 mg total) by mouth 2 (two) times daily. 07/12/14   Joycie PeekBenjamin Cartner, PA-C  traMADol (ULTRAM) 50 MG tablet Take 1  tablet (50 mg total) by mouth every 6 (six) hours as needed. 07/12/14   Joycie PeekBenjamin Cartner, PA-C  venlafaxine XR (EFFEXOR-XR) 150 MG 24 hr capsule Take 225 mg by mouth at bedtime.     Historical Provider, MD  zolpidem (AMBIEN) 10 MG tablet Take 10 mg by mouth at bedtime as needed for sleep.    Historical Provider, MD   BP 117/75 mmHg  Pulse 115  Temp(Src) 98.4 F (36.9 C) (Oral)  Resp 20  SpO2 99%  LMP 09/19/2010 Physical Exam  Constitutional: She is oriented to person, place, and time. Vital signs are normal. She appears well-developed and well-nourished. No distress.  HENT:  Head: Normocephalic and atraumatic.  Neck: Normal range of motion. Neck supple. No JVD present. No tracheal deviation present.  Cardiovascular: Regular rhythm, normal heart sounds, intact distal pulses and normal pulses.  Tachycardia present.   Pulmonary/Chest: Accessory muscle usage present. She is in respiratory distress (Mild). She has wheezes (diffuse, expiratory, with prolonged expiratory component). She has no rhonchi. She has no rales.  Neurological: She is alert and oriented to person, place, and time. She has normal strength. Coordination normal.  Skin: Skin is warm and dry. No rash noted. She is not diaphoretic.  Psychiatric: She has a normal mood and affect. Judgment normal.  Nursing note and vitals reviewed.   ED Course  Procedures (including critical care time) Labs Review Labs Reviewed - No data to display  Imaging Review No results found.   MDM   1. Status asthmaticus, moderate persistent   2. Failure of outpatient treatment    She has failed outpatient management 2. She is on maximal outpatient therapy already. She will likely require admission and IV medications. Transferred to ED via shuttle. She was given 1 DuoNeb nebulizer treatment here before transfer  Meds ordered this encounter  Medications  . albuterol (PROVENTIL) (2.5 MG/3ML) 0.083% nebulizer solution 5 mg    Sig:   .  ipratropium (ATROVENT) nebulizer solution 0.5 mg    Sig:        Graylon GoodZachary H Marta Bouie, PA-C 11/29/14 1647

## 2014-11-29 NOTE — ED Notes (Signed)
Pt  Has  Symptoms  Of  Shortness   Of breath   With  Cough  And     Hoarseness  That  Began  About  3  Weeks  Ago  -  [facility]  And  Took  A  course of     levaquin  And  Prednisone      She  subesequently  Took a  Course  Of    z  Pack     And  Another  Course  Of  Prednisone         -  She    T[facility]   Bre[facility]  At  [facility]

## 2014-11-29 NOTE — ED Notes (Signed)
Dr Miller at bedside. 

## 2014-11-29 NOTE — Discharge Instructions (Signed)
Laryngitis °At the top of your windpipe is your voice box. It is the source of your voice. Inside your voice box are 2 bands of muscles called vocal cords. When you breathe, your vocal cords are relaxed and open so that air can get into the lungs. When you decide to say something, these cords come together and vibrate. The sound from these vibrations goes into your throat and comes out through your mouth as sound.  °Laryngitis is an inflammation of the vocal cords that causes hoarseness, cough, loss of voice, sore throat, and dry throat. Laryngitis can be temporary (acute) or long-term (chronic). Most cases of acute laryngitis improve with time.Chronic laryngitis lasts for more than 3 weeks. °CAUSES °Laryngitis can often be related to excessive smoking, talking, or yelling, as well as inhalation of toxic fumes and allergies. Acute laryngitis is usually caused by a viral infection, vocal strain, measles or mumps, or bacterial infections. Chronic laryngitis is usually caused by vocal cord strain, vocal cord injury, postnasal drip, growths on the vocal cords, or acid reflux. °SYMPTOMS  °· Cough. °· Sore throat. °· Dry throat. °RISK FACTORS °· Respiratory infections. °· Exposure to irritating substances, such as cigarette smoke, excessive amounts of alcohol, stomach acids, and workplace chemicals. °· Voice trauma, such as vocal cord injury from shouting or speaking too loud. °DIAGNOSIS  °Your cargiver will perform a physical exam. During the physical exam, your caregiver will examine your throat. The most common sign of laryngitis is hoarseness. Laryngoscopy may be necessary to confirm the diagnosis of this condition. This procedure allows your caregiver to look into the larynx. °HOME CARE INSTRUCTIONS °· Drink enough fluids to keep your urine clear or pale yellow. °· Rest until you no longer have symptoms or as directed by your caregiver. °· Breathe in moist air. °· Take all medicine as directed by your  caregiver. °· Do not smoke. °· Talk as little as possible (this includes whispering). °· Write on paper instead of talking until your voice is back to normal. °· Follow up with your caregiver if your condition has not improved after 10 days. °SEEK MEDICAL CARE IF:  °· You have trouble breathing. °· You cough up blood. °· You have persistent fever. °· You have increasing pain. °· You have difficulty swallowing. °MAKE SURE YOU: °· Understand these instructions. °· Will watch your condition. °· Will get help right away if you are not doing well or get worse. °Document Released: 07/20/2005 Document Revised: 10/12/2011 Document Reviewed: 09/25/2010 °ExitCare® Patient Information ©2015 [employer]. This information is not intended to replace advice given to you by your health care provider. Make sure you discuss any questions you have with your health care provider. ° °

## 2014-11-29 NOTE — ED Notes (Signed)
Pt denies improvement with breathing treatment. Has had two rounds of antibiotics without improvement x 2 weeks. Pt hoarse. Wheezes noted to L lung

## 2015-01-07 ENCOUNTER — Institutional Professional Consult (permissible substitution): Payer: BLUE CROSS/BLUE SHIELD | Admitting: Emergency Medicine

## 2015-05-13 ENCOUNTER — Emergency Department (INDEPENDENT_AMBULATORY_CARE_PROVIDER_SITE_OTHER)
Admission: EM | Admit: 2015-05-13 | Discharge: 2015-05-13 | Disposition: A | Discharge status: Home / Self Care | Payer: 59 | Source: Home / Self Care

## 2015-05-13 ENCOUNTER — Encounter (HOSPITAL_COMMUNITY): Payer: Self-pay | Admitting: Emergency Medicine

## 2015-05-13 DIAGNOSIS — M545 Low back pain, unspecified: Secondary | ICD-10-CM

## 2015-05-13 DIAGNOSIS — R109 Unspecified abdominal pain: Secondary | ICD-10-CM

## 2015-05-13 LAB — POCT URINALYSIS DIP (DEVICE)
BILIRUBIN URINE: NEGATIVE
Glucose, UA: NEGATIVE mg/dL
Hgb urine dipstick: NEGATIVE
Ketones, ur: NEGATIVE mg/dL
LEUKOCYTES UA: NEGATIVE
NITRITE: NEGATIVE
Protein, ur: NEGATIVE mg/dL
Specific Gravity, Urine: 1.025 (ref 1.005–1.030)
Urobilinogen, UA: 0.2 mg/dL (ref 0.0–1.0)
pH: 6 (ref 5.0–8.0)

## 2015-05-13 MED ORDER — METHOCARBAMOL 500 MG PO TABS: 500.0000 mg | ORAL_TABLET | Freq: Two times a day (BID) | ORAL | Status: DC

## 2015-05-13 NOTE — ED Provider Notes (Signed)
CSN: [id number]     Arrival date & time 05/13/15  1359 History   None    Chief Complaint  Patient presents with  . Nephrolithiasis   (Consider location/radiation/quality/duration/timing/severity/associated sxs/prior Treatment) HPI Comments: Pt reports hx "of kidney stones and this feels the same", no recent trauma or known injury. OTC meds not helping. Pt denies N,V, fever, dysuria or vaginal issues.  Patient is a 34 y.o. female presenting with flank pain. The history is provided by the patient. No language interpreter was used.  Flank Pain This is a recurrent problem. The current episode started 3 to 5 hours ago. The problem occurs constantly. The problem has not changed since onset.Pertinent negatives include no chest pain, no abdominal pain, no headaches and no shortness of breath. Nothing aggravates the symptoms. Nothing relieves the symptoms. She has tried nothing for the symptoms. The treatment provided no relief.    Past Medical History  Diagnosis Date  . Dizziness   . Asthma   . Chronic bronchitis (HCC)   . Endometriosis   . Anxiety   . Morbid obesity with BMI of 40.0-44.9, adult (HCC)   . Abdominal pain   . DEPRESSION 09/20/2007    Qualifier: Diagnosis of  By: Jillyn Hidden FNP, Mcarthur Rossetti    Past Surgical History  Procedure Laterality Date  . Total abdominal hysterectomy  May 2012    Right ovary still in  . Cholecystectomy  June 2011  . Cesarean section    . Salpingectomy  November 2004    Left sided, with adhesion lysis  . Tilt table study N/A 07/18/2012    Procedure: TILT TABLE STUDY;  Surgeon: Duke Salvia, MD;  Location: [facility] CATH LAB;  Service: Cardiovascular;  Laterality: N/A;   Family History  Problem Relation Age of Onset  . Cancer Mother     colon cancer  . Heart failure Father     4 vessel bypass   Social History  Substance Use Topics  . Smoking status: Current Every Day Smoker -- 1.00 packs/day for 15 years    Types: Cigarettes  . Smokeless  tobacco: None  . Alcohol Use: No   OB History    No data available     Review of Systems  Constitutional: Positive for activity change. Negative for fever and chills.  HENT: Negative.   Eyes: Negative.   Respiratory: Negative.  Negative for shortness of breath.   Cardiovascular: Negative for chest pain, palpitations and leg swelling.  Gastrointestinal: Negative for nausea, vomiting and abdominal pain.  Endocrine: Negative.   Genitourinary: Positive for flank pain. Negative for dysuria, hematuria, vaginal bleeding, vaginal discharge, difficulty urinating, vaginal pain and pelvic pain.  Musculoskeletal: Positive for myalgias and back pain. Negative for gait problem.  Skin: Negative for rash.  Neurological: Negative for weakness, numbness and headaches.  Hematological: Negative.   Psychiatric/Behavioral: Negative.   All other systems reviewed and are negative.   Allergies  Amoxicillin-pot clavulanate; Penicillins; Sulfa antibiotics; Cephalosporins; Lexapro; and Toradol  Home Medications   Prior to Admission medications   Medication Sig Start Date End Date Taking? Authorizing Provider  ALPRAZolam Prudy Feeler) 0.5 MG tablet Take 2 mg by mouth 3 (three) times daily as needed for anxiety.   Yes Historical Provider, MD  ARIPiprazole (ABILIFY) 5 MG tablet Take 5 mg by mouth daily.   Yes Historical Provider, MD  FLUoxetine (PROZAC) 40 MG capsule Take 40 mg by mouth daily.   Yes Historical Provider, MD  prazosin (MINIPRESS) 5 MG capsule Take 5  mg by mouth at bedtime.   Yes Historical Provider, MD  albuterol (PROVENTIL HFA;VENTOLIN HFA) 108 (90 BASE) MCG/ACT inhaler Inhale 2 puffs into the lungs every 6 (six) hours as needed. For shortness of breath    Historical Provider, MD  amphetamine-dextroamphetamine (ADDERALL XR) 30 MG 24 hr capsule Take 30 mg by mouth daily.    Historical Provider, MD  amphetamine-dextroamphetamine (ADDERALL) 30 MG tablet Take 30 mg by mouth daily.    Historical Provider,  MD  chlorpheniramine-HYDROcodone (TUSSIONEX PENNKINETIC ER) 10-8 MG/5ML SUER Take 5 mLs by mouth every 12 (twelve) hours as needed for cough. 11/29/14   Eber Hong, MD  cyclobenzaprine (FLEXERIL) 10 MG tablet Take 10 mg by mouth 3 (three) times daily as needed for muscle spasms.    Historical Provider, MD  diazepam (VALIUM) 10 MG tablet Take 10 mg by mouth 3 (three) times daily as needed for anxiety.    Historical Provider, MD  EPINEPHrine (EPIPEN) 0.3 mg/0.3 mL DEVI Inject 0.3 mg into the muscle once.    Historical Provider, MD  fluticasone (FLONASE) 50 MCG/ACT nasal spray Place 2 sprays into both nostrils daily.    Historical Provider, MD  Fluticasone-Salmeterol (ADVAIR) 500-50 MCG/DOSE AEPB Inhale 2 puffs into the lungs 2 (two) times daily.    Historical Provider, MD  ibuprofen (ADVIL,MOTRIN) 800 MG tablet Take 800 mg by mouth every 8 (eight) hours as needed. For pain    Historical Provider, MD  methocarbamol (ROBAXIN) 500 MG tablet Take 1 tablet (500 mg total) by mouth 2 (two) times daily. 05/13/15   Devonna Oboyle, NP  montelukast (SINGULAIR) 10 MG tablet Take 10 mg by mouth at bedtime.    Historical Provider, MD  naproxen (NAPROSYN) 500 MG tablet Take 1 tablet (500 mg total) by mouth 2 (two) times daily. Patient not taking: Reported on 11/29/2014 07/12/14   Joycie Peek, PA-C  omeprazole (PRILOSEC) 40 MG capsule Take 40 mg by mouth daily as needed. For shortness of breath    Historical Provider, MD  traMADol (ULTRAM) 50 MG tablet Take 1 tablet (50 mg total) by mouth every 6 (six) hours as needed. Patient not taking: Reported on 11/29/2014 07/12/14   Joycie Peek, PA-C  venlafaxine XR (EFFEXOR-XR) 150 MG 24 hr capsule Take 225 mg by mouth at bedtime.     Historical Provider, MD  zolpidem (AMBIEN) 10 MG tablet Take 10 mg by mouth at bedtime as needed for sleep.    Historical Provider, MD   Meds Ordered and Administered this Visit  Medications - No data to display  BP 105/78 mmHg   Pulse 92  Temp(Src) 98.1 F (36.7 C) (Oral)  Resp 16  SpO2 100%  LMP 09/19/2010 No data found.   Physical Exam  Constitutional: She is oriented to person, place, and time. Vital signs are normal. She appears well-developed and well-nourished. She is active and cooperative.  Non-toxic appearance. She does not have a sickly appearance. She does not appear ill. She appears distressed.  Cardiovascular: Intact distal pulses and normal pulses.   Pulses:      Dorsalis pedis pulses are 2+ on the right side, and 2+ on the left side.  Musculoskeletal:       Lumbar back: She exhibits tenderness, pain and spasm. She exhibits normal range of motion, no bony tenderness, no swelling, no edema, no deformity, no laceration and normal pulse.  -SLE bilateral, dorsiflex/ext equal bilateral, MAEW, no foot drop, NV intact  Neurological: She is alert and oriented to person, place,  and time. She has normal strength and normal reflexes. No cranial nerve deficit or sensory deficit. She displays a negative Romberg sign. GCS eye subscore is 4. GCS verbal subscore is 5. GCS motor subscore is 6.  Psychiatric: She has a normal mood and affect. Her speech is normal.  Nursing note and vitals reviewed.   ED Course  Procedures (including critical care time)  Labs Review Labs Reviewed  POCT URINALYSIS DIP (DEVICE)    Imaging Review No results found.      MDM   1. Left flank pain   2. Left-sided low back pain without sciatica     1606: Offered pt Toradol(pt refused). Discussed plan of care with pt: Your urine was normal at UC today. Take home meds as directed. Follow up with your PCP, Hyman Hopes, for recheck in 2 days if symptoms persist. If you develop fever, nausea or worsening symptoms follow up with ER for additional testing and possible imaging. May apply heat or ice to affected area 20 min 3 x daily. Avoid excessive lifting, twisting turning as it will aggravate your back. May take OTC tylenol/ibuprofen as  label directed for discomfort. Robaxin as directed for muscle spasms.  Return to UC as needed. Pt verbalized understanding to this provider.   Clancy Gourd, NP 05/13/15 2224

## 2015-05-13 NOTE — Discharge Instructions (Signed)
Flank Pain Flank pain is pain in your side. The flank is the area of your side between your upper belly (abdomen) and your back. Pain in this area can be caused by many different things. HOME CARE Home care and treatment will depend on the cause of your pain.  Rest as told by your doctor.  Drink enough fluids to keep your pee (urine) clear or pale yellow.  Only take medicine as told by your doctor.  Tell your doctor about any changes in your pain.  Follow up with your doctor. GET HELP RIGHT AWAY IF:   Your pain does not get better with medicine.   You have new symptoms or your symptoms get worse.  Your pain gets worse.   You have belly (abdominal) pain.   You are short of breath.   You always feel sick to your stomach (nauseous).   You keep throwing up (vomiting).   You have puffiness (swelling) in your belly.   You feel light-headed or you pass out (faint).   You have blood in your pee.  You have a fever or lasting symptoms for more than 2-3 days.  You have a fever and your symptoms suddenly get worse. MAKE SURE YOU:   Understand these instructions.  Will watch your condition.  Will get help right away if you are not doing well or get worse.   This information is not intended to replace advice given to you by your health care provider. Make sure you discuss any questions you have with your health care provider.   Your urine was normal at UC today. Take home meds as directed. Follow up with your PCP, Angie [name], for recheck in 2 days if symptoms persist. If you develop fever, nausea or worsening symptoms follow up with ER for additional testing and possible imaging. May apply heat or ice to affected area 20 min 3 x daily. Avoid excessive lifting, twisting turning as it will aggravate your back. May take OTC tylenol/ibuprofen as label directed for discomfort. Robaxin as directed for muscle spasms.  Return to UC as needed.    Document Released: 04/28/2008 Document  Revised: 08/10/2014 Document Reviewed: 03/03/2012 Elsevier Interactive Patient Education [employer]

## 2015-05-13 NOTE — ED Notes (Signed)
Pt started having left mid back pain yesterday that she is now saying feels just like it did when she had kidney stones 7-8 months ago.  Pt states she is having 9/10 pain.

## 2015-06-03 ENCOUNTER — Emergency Department (HOSPITAL_COMMUNITY)
Admission: EM | Admit: 2015-06-03 | Discharge: 2015-06-04 | Disposition: A | Discharge status: Other Acute Inpatient Hospital | Payer: 59 | Attending: Emergency Medicine | Admitting: Emergency Medicine

## 2015-06-03 ENCOUNTER — Encounter (HOSPITAL_COMMUNITY): Payer: Self-pay | Admitting: Emergency Medicine

## 2015-06-03 DIAGNOSIS — F410 Panic disorder [episodic paroxysmal anxiety] without agoraphobia: Secondary | ICD-10-CM | POA: Insufficient documentation

## 2015-06-03 DIAGNOSIS — M6281 Muscle weakness (generalized): Secondary | ICD-10-CM | POA: Insufficient documentation

## 2015-06-03 DIAGNOSIS — F329 Major depressive disorder, single episode, unspecified: Secondary | ICD-10-CM | POA: Diagnosis not present

## 2015-06-03 DIAGNOSIS — F919 Conduct disorder, unspecified: Secondary | ICD-10-CM | POA: Diagnosis present

## 2015-06-03 DIAGNOSIS — Y9289 Other specified places as the place of occurrence of the external cause: Secondary | ICD-10-CM | POA: Insufficient documentation

## 2015-06-03 DIAGNOSIS — F32A Depression, unspecified: Secondary | ICD-10-CM

## 2015-06-03 DIAGNOSIS — Z79899 Other long term (current) drug therapy: Secondary | ICD-10-CM | POA: Insufficient documentation

## 2015-06-03 DIAGNOSIS — Z880 Allergy status to penicillin: Secondary | ICD-10-CM | POA: Diagnosis not present

## 2015-06-03 DIAGNOSIS — Z720 Tobacco use: Secondary | ICD-10-CM | POA: Diagnosis not present

## 2015-06-03 DIAGNOSIS — Z8742 Personal history of other diseases of the female genital tract: Secondary | ICD-10-CM | POA: Diagnosis not present

## 2015-06-03 DIAGNOSIS — Z7951 Long term (current) use of inhaled steroids: Secondary | ICD-10-CM | POA: Diagnosis not present

## 2015-06-03 DIAGNOSIS — T424X1A Poisoning by benzodiazepines, accidental (unintentional), initial encounter: Secondary | ICD-10-CM | POA: Diagnosis not present

## 2015-06-03 DIAGNOSIS — Y998 Other external cause status: Secondary | ICD-10-CM | POA: Insufficient documentation

## 2015-06-03 DIAGNOSIS — F4310 Post-traumatic stress disorder, unspecified: Secondary | ICD-10-CM | POA: Insufficient documentation

## 2015-06-03 DIAGNOSIS — J45909 Unspecified asthma, uncomplicated: Secondary | ICD-10-CM | POA: Diagnosis not present

## 2015-06-03 DIAGNOSIS — Y9389 Activity, other specified: Secondary | ICD-10-CM | POA: Insufficient documentation

## 2015-06-03 HISTORY — DX: Post-traumatic stress disorder, unspecified: F43.10

## 2015-06-03 HISTORY — DX: Panic disorder (episodic paroxysmal anxiety): F41.0

## 2015-06-03 LAB — RAPID URINE DRUG SCREEN, HOSP PERFORMED
Amphetamines: NOT DETECTED
BARBITURATES: NOT DETECTED
BENZODIAZEPINES: POSITIVE — AB
COCAINE: NOT DETECTED
Opiates: NOT DETECTED
Tetrahydrocannabinol: NOT DETECTED

## 2015-06-03 LAB — CBC WITH DIFFERENTIAL/PLATELET
BASOS ABS: 0 10*3/uL (ref 0.0–0.1)
Basophils Relative: 0 %
EOS PCT: 4 %
Eosinophils Absolute: 0.3 10*3/uL (ref 0.0–0.7)
HCT: 38.9 % (ref 36.0–46.0)
Hemoglobin: 12.8 g/dL (ref 12.0–15.0)
LYMPHS PCT: 27 %
Lymphs Abs: 2 10*3/uL (ref 0.7–4.0)
MCH: 30.3 pg (ref 26.0–34.0)
MCHC: 32.9 g/dL (ref 30.0–36.0)
MCV: 92.2 fL (ref 78.0–100.0)
MONO ABS: 0.5 10*3/uL (ref 0.1–1.0)
MONOS PCT: 7 %
Neutro Abs: 4.7 10*3/uL (ref 1.7–7.7)
Neutrophils Relative %: 62 %
Platelets: 302 10*3/uL (ref 150–400)
RBC: 4.22 MIL/uL (ref 3.87–5.11)
RDW: 14 % (ref 11.5–15.5)
WBC: 7.4 10*3/uL (ref 4.0–10.5)

## 2015-06-03 LAB — COMPREHENSIVE METABOLIC PANEL
ALT: 27 U/L (ref 14–54)
AST: 21 U/L (ref 15–41)
Albumin: 3.7 g/dL (ref 3.5–5.0)
Alkaline Phosphatase: 56 U/L (ref 38–126)
Anion gap: 9 (ref 5–15)
BUN: 11 mg/dL (ref 6–20)
CO2: 25 mmol/L (ref 22–32)
CREATININE: 0.73 mg/dL (ref 0.44–1.00)
Calcium: 9.3 mg/dL (ref 8.9–10.3)
Chloride: 104 mmol/L (ref 101–111)
Glucose, Bld: 89 mg/dL (ref 65–99)
POTASSIUM: 3.4 mmol/L — AB (ref 3.5–5.1)
Sodium: 138 mmol/L (ref 135–145)
TOTAL PROTEIN: 6.2 g/dL — AB (ref 6.5–8.1)
Total Bilirubin: 0.4 mg/dL (ref 0.3–1.2)

## 2015-06-03 LAB — ETHANOL: Alcohol, Ethyl (B): <5 mg/dL (ref <5)

## 2015-06-03 NOTE — ED Notes (Signed)
PA to see and assess patient before RN assessment. 

## 2015-06-03 NOTE — ED Provider Notes (Signed)
CSN: [id number]     Arrival date & time 06/03/15  2038 History   First MD Initiated Contact with Patient 06/03/15 2211     Chief Complaint  Patient presents with  . Behavior Problem  . Extremity Weakness     (Consider location/radiation/quality/duration/timing/severity/associated sxs/prior Treatment) HPI Comments: The patient is brought in by her ex-mother-in-law and ex-sister-in-law for "odd behavior". Per family, she has been "talking out of her head". By example, she is asking if anything happened at the pig picking when there hasn't been a pig picking in years. She repeats questions such as asking where her nephew is more than 5 times in a matter of a couple hours. She has been stumbling without injurious fall. Family concerned because she has abused her prescription medication in the past. Family at bedside states they counted her Xanax prescription and found 24 pills missing out of a new bottle suggesting overdosing. The patient denies SI/HI. She reports feeling dizzy. She currently denies abusing medications.  The history is provided by the patient. No language interpreter was used.    Past Medical History  Diagnosis Date  . Dizziness   . Asthma   . Chronic bronchitis (HCC)   . Endometriosis   . Anxiety   . Morbid obesity with BMI of 40.0-44.9, adult (HCC)   . Abdominal pain   . DEPRESSION 09/20/2007    Qualifier: Diagnosis of  By: Jillyn Hidden FNP, Mcarthur Rossetti   . Anxiety   . PTSD (post-traumatic stress disorder)   . Panic attack    Past Surgical History  Procedure Laterality Date  . Total abdominal hysterectomy  May 2012    Right ovary still in  . Cholecystectomy  June 2011  . Cesarean section    . Salpingectomy  November 2004    Left sided, with adhesion lysis  . Tilt table study N/A 07/18/2012    Procedure: TILT TABLE STUDY;  Surgeon: Duke Salvia, MD;  Location: [facility] CATH LAB;  Service: Cardiovascular;  Laterality: N/A;   Family History  Problem Relation Age of  Onset  . Cancer Mother     colon cancer  . Heart failure Father     4 vessel bypass   Social History  Substance Use Topics  . Smoking status: Current Every Day Smoker -- 0.00 packs/day for 0 years    Types: Cigarettes  . Smokeless tobacco: None  . Alcohol Use: No   OB History    No data available     Review of Systems  Constitutional: Negative for fever and chills.  HENT: Negative.   Respiratory: Negative.   Cardiovascular: Negative.   Gastrointestinal: Negative.   Musculoskeletal: Positive for extremity weakness.  Skin: Negative.   Neurological: Positive for weakness.  Psychiatric/Behavioral: Positive for behavioral problems and confusion. Negative for sleep disturbance.      Allergies  Amoxicillin-pot clavulanate; Penicillins; Sulfa antibiotics; Cephalosporins; Lexapro; and Toradol  Home Medications   Prior to Admission medications   Medication Sig Start Date End Date Taking? Authorizing Provider  albuterol (PROVENTIL HFA;VENTOLIN HFA) 108 (90 BASE) MCG/ACT inhaler Inhale 2 puffs into the lungs every 6 (six) hours as needed. For shortness of breath    Historical Provider, MD  ALPRAZolam Prudy Feeler) 0.5 MG tablet Take 2 mg by mouth 3 (three) times daily as needed for anxiety.    Historical Provider, MD  amphetamine-dextroamphetamine (ADDERALL XR) 30 MG 24 hr capsule Take 30 mg by mouth daily.    Historical Provider, MD  amphetamine-dextroamphetamine (ADDERALL) 30 MG  tablet Take 30 mg by mouth daily.    Historical Provider, MD  ARIPiprazole (ABILIFY) 5 MG tablet Take 5 mg by mouth daily.    Historical Provider, MD  chlorpheniramine-HYDROcodone (TUSSIONEX PENNKINETIC ER) 10-8 MG/5ML SUER Take 5 mLs by mouth every 12 (twelve) hours as needed for cough. 11/29/14   Eber Hong, MD  cyclobenzaprine (FLEXERIL) 10 MG tablet Take 10 mg by mouth 3 (three) times daily as needed for muscle spasms.    Historical Provider, MD  diazepam (VALIUM) 10 MG tablet Take 10 mg by mouth 3 (three)  times daily as needed for anxiety.    Historical Provider, MD  EPINEPHrine (EPIPEN) 0.3 mg/0.3 mL DEVI Inject 0.3 mg into the muscle once.    Historical Provider, MD  FLUoxetine (PROZAC) 40 MG capsule Take 40 mg by mouth daily.    Historical Provider, MD  fluticasone (FLONASE) 50 MCG/ACT nasal spray Place 2 sprays into both nostrils daily.    Historical Provider, MD  Fluticasone-Salmeterol (ADVAIR) 500-50 MCG/DOSE AEPB Inhale 2 puffs into the lungs 2 (two) times daily.    Historical Provider, MD  ibuprofen (ADVIL,MOTRIN) 800 MG tablet Take 800 mg by mouth every 8 (eight) hours as needed. For pain    Historical Provider, MD  methocarbamol (ROBAXIN) 500 MG tablet Take 1 tablet (500 mg total) by mouth 2 (two) times daily. 05/13/15   Jeanette Defelice, NP  montelukast (SINGULAIR) 10 MG tablet Take 10 mg by mouth at bedtime.    Historical Provider, MD  naproxen (NAPROSYN) 500 MG tablet Take 1 tablet (500 mg total) by mouth 2 (two) times daily. Patient not taking: Reported on 11/29/2014 07/12/14   Joycie Peek, PA-C  omeprazole (PRILOSEC) 40 MG capsule Take 40 mg by mouth daily as needed. For shortness of breath    Historical Provider, MD  prazosin (MINIPRESS) 5 MG capsule Take 5 mg by mouth at bedtime.    Historical Provider, MD  traMADol (ULTRAM) 50 MG tablet Take 1 tablet (50 mg total) by mouth every 6 (six) hours as needed. Patient not taking: Reported on 11/29/2014 07/12/14   Joycie Peek, PA-C  venlafaxine XR (EFFEXOR-XR) 150 MG 24 hr capsule Take 225 mg by mouth at bedtime.     Historical Provider, MD  zolpidem (AMBIEN) 10 MG tablet Take 10 mg by mouth at bedtime as needed for sleep.    Historical Provider, MD   BP 129/83 mmHg  Pulse 90  Temp(Src) 98.2 F (36.8 C) (Oral)  Resp 14  Ht  (1.6 m)  Wt 269 lb (122.018 kg)  BMI 47.66 kg/m2  SpO2 100%  LMP 09/19/2010 Physical Exam  Constitutional: She is oriented to person, place, and time. She appears well-developed and  well-nourished.  HENT:  Head: Normocephalic.  Neck: Normal range of motion. Neck supple.  Cardiovascular: Normal rate and regular rhythm.   Pulmonary/Chest: Effort normal and breath sounds normal. She has no wheezes. She has no rales.  Abdominal: Soft. Bowel sounds are normal. There is no tenderness. There is no rebound and no guarding.  Musculoskeletal: Normal range of motion.  Neurological: She is alert and oriented to person, place, and time. Coordination normal.  Speech is coherent but sluggish. She is able to perform finger to nose coordination but movements are significantly delayed and slow.   Skin: Skin is warm and dry. No rash noted.  Psychiatric: She has a normal mood and affect.    ED Course  Procedures (including critical care time) Labs Review Labs Reviewed  URINE RAPID DRUG SCREEN, HOSP PERFORMED - Abnormal; Notable for the following:    Benzodiazepines POSITIVE (*)    All other components within normal limits  COMPREHENSIVE METABOLIC PANEL - Abnormal; Notable for the following:    Potassium 3.4 (*)    Total Protein 6.2 (*)    All other components within normal limits  ETHANOL  CBC WITH DIFFERENTIAL/PLATELET    Imaging Review No results found. I have personally reviewed and evaluated these images and lab results as part of my medical decision-making.   EKG Interpretation None      MDM   Final diagnoses:  None    1. Drug overdose, accidental 2. Benzodiazapine dependence/abuse  The patient is re-examined. She is oriented, no mental status deterioration. She reports she has been abusing her Xanax and that she wants help for drug addiction. She continues to deny SI/HI, hallucinations. TTS consult, holding orders place including CIWA for anticipated benzodiazapine withdrawal.    Elpidio AnisShari Esmond Hinch, PA-C 06/04/15 0031  Lavera Guiseana Duo Liu, MD 06/04/15 [phone]

## 2015-06-03 NOTE — ED Notes (Addendum)
Mother reported pt. is exhibiting " weird behavior" , stumbling , generalized weakness , legs weakness onset today , speech clear , no facial symmetry , equal grips with no arm drift. Denies suicidal ideation / no hallucinations . Mother suspects pt. has been taking her Xanax more often .

## 2015-06-04 ENCOUNTER — Inpatient Hospital Stay (HOSPITAL_COMMUNITY)
Admission: EM | Admit: 2015-06-04 | Discharge: 2015-06-08 | DRG: 885 | Disposition: A | Discharge status: Home / Self Care | Payer: 59 | Source: Intra-hospital | Attending: Psychiatry | Admitting: Psychiatry

## 2015-06-04 ENCOUNTER — Encounter (HOSPITAL_COMMUNITY): Payer: Self-pay

## 2015-06-04 DIAGNOSIS — F331 Major depressive disorder, recurrent, moderate: Principal | ICD-10-CM | POA: Diagnosis present

## 2015-06-04 DIAGNOSIS — F1320 Sedative, hypnotic or anxiolytic dependence, uncomplicated: Secondary | ICD-10-CM | POA: Diagnosis not present

## 2015-06-04 DIAGNOSIS — F1999 Other psychoactive substance use, unspecified with unspecified psychoactive substance-induced disorder: Secondary | ICD-10-CM | POA: Diagnosis not present

## 2015-06-04 DIAGNOSIS — Z6841 Body Mass Index (BMI) 40.0 and over, adult: Secondary | ICD-10-CM

## 2015-06-04 DIAGNOSIS — F1994 Other psychoactive substance use, unspecified with psychoactive substance-induced mood disorder: Secondary | ICD-10-CM

## 2015-06-04 DIAGNOSIS — F4310 Post-traumatic stress disorder, unspecified: Secondary | ICD-10-CM | POA: Diagnosis present

## 2015-06-04 DIAGNOSIS — F1324 Sedative, hypnotic or anxiolytic dependence with sedative, hypnotic or anxiolytic-induced mood disorder: Secondary | ICD-10-CM | POA: Diagnosis present

## 2015-06-04 DIAGNOSIS — F17210 Nicotine dependence, cigarettes, uncomplicated: Secondary | ICD-10-CM | POA: Diagnosis present

## 2015-06-04 DIAGNOSIS — F329 Major depressive disorder, single episode, unspecified: Secondary | ICD-10-CM | POA: Diagnosis present

## 2015-06-04 LAB — CBG MONITORING, ED: GLUCOSE-CAPILLARY: 93 mg/dL (ref 65–99)

## 2015-06-04 MED ORDER — ENSURE ENLIVE PO LIQD
237.0000 mL | Freq: Two times a day (BID) | ORAL | Status: DC
  Administered 2015-06-04 – 2015-06-07 (×7): 237 mL via ORAL

## 2015-06-04 MED ORDER — FLUOXETINE HCL 20 MG PO CAPS
40.0000 mg | ORAL_CAPSULE | Freq: Every day | ORAL | Status: DC
  Administered 2015-06-04 – 2015-06-08 (×5): 40 mg via ORAL
  Filled 2015-06-04 (×8): qty 2

## 2015-06-04 MED ORDER — ARIPIPRAZOLE 5 MG PO TABS: 5.0000 mg | ORAL_TABLET | Freq: Every day | ORAL | Status: DC

## 2015-06-04 MED ORDER — HYDROXYZINE HCL 25 MG PO TABS
25.0000 mg | ORAL_TABLET | Freq: Four times a day (QID) | ORAL | Status: AC | PRN
  Administered 2015-06-05 – 2015-06-06 (×2): 25 mg via ORAL
  Filled 2015-06-04 (×2): qty 1

## 2015-06-04 MED ORDER — LORAZEPAM 1 MG PO TABS: 0.0000 mg | ORAL_TABLET | Freq: Two times a day (BID) | ORAL | Status: DC

## 2015-06-04 MED ORDER — ADULT MULTIVITAMIN W/MINERALS CH
1.0000 | ORAL_TABLET | Freq: Every day | ORAL | Status: DC
  Administered 2015-06-04 – 2015-06-08 (×5): 1 via ORAL
  Filled 2015-06-04 (×8): qty 1

## 2015-06-04 MED ORDER — CHLORDIAZEPOXIDE HCL 25 MG PO CAPS: 25.0000 mg | ORAL_CAPSULE | ORAL | Status: DC

## 2015-06-04 MED ORDER — NICOTINE 21 MG/24HR TD PT24: 21.0000 mg | MEDICATED_PATCH | Freq: Every day | TRANSDERMAL | Status: DC

## 2015-06-04 MED ORDER — CHLORDIAZEPOXIDE HCL 25 MG PO CAPS
25.0000 mg | ORAL_CAPSULE | Freq: Four times a day (QID) | ORAL | Status: AC
  Administered 2015-06-04 – 2015-06-05 (×4): 25 mg via ORAL
  Filled 2015-06-04 (×4): qty 1

## 2015-06-04 MED ORDER — ONDANSETRON 4 MG PO TBDP
4.0000 mg | ORAL_TABLET | Freq: Four times a day (QID) | ORAL | Status: AC | PRN
  Administered 2015-06-04: 4 mg via ORAL
  Filled 2015-06-04: qty 1

## 2015-06-04 MED ORDER — ALUM & MAG HYDROXIDE-SIMETH 200-200-20 MG/5ML PO SUSP: 30.0000 mL | ORAL | Status: DC | PRN

## 2015-06-04 MED ORDER — ZOLPIDEM TARTRATE 10 MG PO TABS
10.0000 mg | ORAL_TABLET | Freq: Every evening | ORAL | Status: DC | PRN
  Administered 2015-06-04 – 2015-06-07 (×4): 10 mg via ORAL
  Filled 2015-06-04 (×4): qty 1

## 2015-06-04 MED ORDER — CHLORDIAZEPOXIDE HCL 25 MG PO CAPS: 25.0000 mg | ORAL_CAPSULE | Freq: Every day | ORAL | Status: DC

## 2015-06-04 MED ORDER — CHLORDIAZEPOXIDE HCL 25 MG PO CAPS: 25.0000 mg | ORAL_CAPSULE | Freq: Three times a day (TID) | ORAL | Status: DC

## 2015-06-04 MED ORDER — LOPERAMIDE HCL 2 MG PO CAPS: 2.0000 mg | ORAL_CAPSULE | ORAL | Status: AC | PRN

## 2015-06-04 MED ORDER — MAGNESIUM HYDROXIDE 400 MG/5ML PO SUSP: 30.0000 mL | Freq: Every day | ORAL | Status: DC | PRN

## 2015-06-04 MED ORDER — ACETAMINOPHEN 325 MG PO TABS
650.0000 mg | ORAL_TABLET | Freq: Four times a day (QID) | ORAL | Status: DC | PRN
  Administered 2015-06-06: 650 mg via ORAL
  Filled 2015-06-04: qty 2

## 2015-06-04 MED ORDER — LORAZEPAM 1 MG PO TABS
0.0000 mg | ORAL_TABLET | Freq: Four times a day (QID) | ORAL | Status: DC
  Administered 2015-06-04: 1 mg via ORAL
  Filled 2015-06-04: qty 1

## 2015-06-04 MED ORDER — ARIPIPRAZOLE 5 MG PO TABS
5.0000 mg | ORAL_TABLET | Freq: Every day | ORAL | Status: DC
  Administered 2015-06-04 – 2015-06-07 (×4): 5 mg via ORAL
  Filled 2015-06-04 (×6): qty 1

## 2015-06-04 MED ORDER — NICOTINE POLACRILEX 2 MG MT GUM
2.0000 mg | CHEWING_GUM | OROMUCOSAL | Status: DC | PRN
  Administered 2015-06-04 – 2015-06-07 (×5): 2 mg via ORAL
  Filled 2015-06-04 (×2): qty 1

## 2015-06-04 MED ORDER — ACETAMINOPHEN 325 MG PO TABS
650.0000 mg | ORAL_TABLET | ORAL | Status: DC | PRN
  Administered 2015-06-04: 650 mg via ORAL
  Filled 2015-06-04: qty 2

## 2015-06-04 MED ORDER — CHLORDIAZEPOXIDE HCL 25 MG PO CAPS: 25.0000 mg | ORAL_CAPSULE | Freq: Four times a day (QID) | ORAL | Status: DC | PRN

## 2015-06-04 NOTE — [facility] Suicide Risk Assessment (Signed)
[facility]   Nursing information obtained from:  Patient Demographic factors:  Caucasian, Unemployed, Low socioeconomic status Current Mental Status:  NA Loss Factors:  Loss of significant relationship, Financial problems / change in socioeconomic status Historical Factors:  NA Risk Reduction Factors:  Living with another person, especially a relative Total Time spent with patient: 45 minutes Principal Problem: Substance-induced disorder (HCC) Diagnosis:   Patient Active Problem List   Diagnosis Date Noted  . PTSD (post-traumatic stress disorder) [F43.10] 06/04/2015  . Benzodiazepine dependence, continuous (HCC) [F13.20] 06/04/2015  . Substance-induced disorder (HCC) [F19.99] 06/04/2015  . Depression, major, recurrent, moderate (HCC) [F33.1] 06/04/2015  . Obstructive sleep apnea [G47.33] 07/08/2012  . Dizziness [R42]   . Morbid obesity with BMI of 40.0-44.9, adult (HCC) [E66.01, Z68.41]   . Anxiety [F41.9]   . Endometriosis [N80.9]   . Chronic bronchitis (HCC) [J42]   . ASTHMA [J45.909] 12/07/2007  . INSOMNIA [G47.00] 10/25/2007  . DEPRESSION [F32.9] 09/20/2007  . COMMON MIGRAINE [G43.009] 09/20/2007     Continued Clinical Symptoms:  Alcohol Use Disorder Identification Test Final Score (AUDIT): 0 The "Alcohol Use Disorders Identification Test", Guidelines for Use in Primary Care, Second Edition.  World Science writerHealth Organization [facility](WHO). Score between 0-7:  no or low risk or alcohol related problems. Score between 8-15:  moderate risk of alcohol related problems. Score between 16-19:  high risk of alcohol related problems. Score 20 or above:  warrants further diagnostic evaluation for alcohol dependence and treatment.   CLINICAL FACTORS:   Depression:   Comorbid alcohol abuse/dependence Alcohol/Substance Abuse/Dependencies  Psychiatric Specialty Exam: Physical Exam  ROS  Blood pressure 91/52, pulse 79, temperature 98.9 F (37.2 C), temperature source  Oral, resp. rate 18, height 5' 2.5" (1.588 m), weight 122.018 kg (269 lb), last menstrual period 09/19/2010, SpO2 100 %.Body mass index is 48.39 kg/(m^2).    COGNITIVE FEATURES THAT CONTRIBUTE TO RISK:  Closed-mindedness, Polarized thinking and Thought constriction (tunnel vision)    SUICIDE RISK:   Moderate:  Frequent suicidal ideation with limited intensity, and duration, some specificity in terms of plans, no associated intent, good self-control, limited dysphoria/symptomatology, some risk factors present, and identifiable protective factors, including available and accessible social support.  PLAN OF CARE: See admission H and P  Medical Decision Making:  Review of Psycho-Social Stressors (1), Review or order clinical lab tests (1), Review of Medication Regimen & Side Effects (2) and Review of New Medication or Change in Dosage (2)  I certify that inpatient services furnished can reasonably be expected to improve the patient's condition.   Rilie Glanz A 06/04/2015, 5:14 PM

## 2015-06-04 NOTE — [facility] Group Notes (Signed)
[facility] LCSW Group Therapy  06/04/2015 2:03 PM  Type of Therapy:  Group Therapy  Participation Level:  Active  Participation Quality:  Attentive  Affect:  Appropriate  Cognitive:  Alert  Insight:  Improving  Engagement in Therapy:  Improving  Modes of Intervention:  Confrontation, Discussion, Education, Exploration, Problem-solving, Rapport Building, Socialization and Support  Summary of Progress/Problems:[facility]HA speaker was out today. CSW provided handouts and educational information pertaining to groups and services offered by the [facility]. CSW also provided information for pts [facility](IRC pamphlet, AA/NA information, community resources, [place] houses/halfway houses). Patients were given the opportunity to share their experiences with these resources in the community and add to the list to provide additional resources to the group. Angie [name] was attentive and engaged during today's processing group. She [facility] that [facility] and was interested in peer support counseling. "I have PTSD and want to talk to someone who actually knows what I'm experiencing." Angie [name] shared that she was forced to come to the hospital but is glad to be here now. "I need my meds adjusted. My depression and PTSD have gotten so bad." She is demonstrating improving insight and progress in the group setting.   Smart, Derl Abalos LCSWA 06/04/2015, 2:03 PM

## 2015-06-04 NOTE — ED Notes (Signed)
Pt on phone w/family member - pt confirmed no valuables in ED and that family took them home.

## 2015-06-04 NOTE — ED Notes (Signed)
TTS being done at this time.  
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
## 2015-06-04 NOTE — Progress Notes (Signed)
This a 34 yr old [facility] being [facility] there by Ex- inlaws stating that pt has not been herself and was exhibiting weird behavior. During admission, pt looked drowsy but alert and was able to answer all the questioned asked. Pt stated that her main stressor is trying to get the custody of her 33 yr old son who currently is living with his father. 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 Pt mains concern is when she is getting discharged, and also getting her medication adjusted. Pt was very cooperative during admission, denied SI, HI and verbally contracted for safety. Complained of headache 5/10. Pt was taken to unit and shown her room and introduced to the staff. 15 min checks are in place to maintain safety, we will continue to monitor.

## 2015-06-04 NOTE — Progress Notes (Signed)
[facility] Group Notes:  (Nursing/MHT/Case Management/Adjunct)  Date:  06/04/2015  Time:  9:16 PM  Type of Therapy:  Psychoeducational Skills  Participation Level:  Active  Participation Quality:  Appropriate, Attentive, Sharing and Supportive  Affect:  Appropriate  Cognitive:  Appropriate  Insight:  Appropriate  Engagement in Group:  Engaged  Modes of Intervention:  Discussion  Summary of Progress/Problems: Pt attended group this evening and was appropriate. Pt rated her day a 4/10, stating this is a scary place at first and she had to admit the wrong things she has done. Pt stated her goal for today was to get some rest, which she was able to accomplish. One positive thing, pt stated the people here are nice and that can really make a difference. Caswell CorwinOwen, Angie [name] C 06/04/2015, 9:16 PM

## 2015-06-04 NOTE — ED Notes (Signed)
Dr Schlossman in w/pt. 

## 2015-06-04 NOTE — ED Notes (Signed)
[phone] will call to notify time may be transported.
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
## 2015-06-04 NOTE — ED Notes (Signed)
Pt wanded by security. 

## 2015-06-04 NOTE — Progress Notes (Signed)
Recreation Therapy Notes  Animal-Assisted Activity (AAA) Program Checklist/Progress Notes Patient Eligibility Criteria Checklist & Daily Group note for Rec Tx Intervention  Date: 11.01.2016 Time: 2:15pm [address]   AAA/T Program Assumption of Risk Form signed by Patient/ or Parent Legal Guardian yes  Patient is free of allergies or sever asthma yes  Patient reports no fear of animals yes  Patient reports no history of cruelty to animals yes  Patient understands his/her participation is voluntary yes  Patient washes hands before animal contact yes  Patient washes hands after animal contact yes  Behavioral Response: Appropriate   Education: Hand Washing, Appropriate Animal Interaction   Education Outcome: Acknowledges education.   Clinical Observations/Feedback: Patient interacted appropriately with therapy dog, petting him and interacting with peers appropriately during session. Patient additionally shared stories about her pets at home and asked appropriate questions about therapy dog and his training.   Marykay Lexenise L Verina Galeno, LRT/CTRS        Taylie Helder L 06/04/2015 2:30 PM

## 2015-06-04 NOTE — Progress Notes (Signed)
Pt was observed sitting in the dayroom talking with a female peer and watching TV.  She states she was admitted earlier today for depression, SI, and detox.  According to her report, she has been abusing xanax.  She denies any withdrawal symptoms at this time.  She denies SI/HI/AVH, and says that she feels safe here.  She presents anxious about being here.  She was encouraged to makes her needs and concerns known to staff.  Pt voiced understanding.  Pt was also assured that writer would check to make sure there were orders put in for her.  She reports she has poor sleep and would need a sleep aid.  Support and encouragement offered.  Pt was given Ambien for sleep per orders along with the scheduled meds from the Librium protocol.  Safety maintained with q15 minute checks.

## 2015-06-04 NOTE — ED Notes (Signed)
Pt signed consent forms [facility] to [facility] records.

## 2015-06-04 NOTE — Tx Team (Signed)
Initial Interdisciplinary Treatment Plan   PATIENT STRESSORS: Financial difficulties Marital or family conflict Occupational concerns Substance abuse   PATIENT STRENGTHS: Ability for insight Communication skills Supportive family/friends   PROBLEM LIST: Problem List/Patient Goals Date to be addressed Date deferred Reason deferred Estimated date of resolution  "Improve myself esteem." 06/04/15     "Live a healthy life." 06/04/15     Substance abuse  06/04/15     Depression  06/04/15                                    DISCHARGE CRITERIA:  Ability to meet basic life and health needs Improved stabilization in mood, thinking, and/or behavior Reduction of life-threatening or endangering symptoms to within safe limits Verbal commitment to aftercare and medication compliance  PRELIMINARY DISCHARGE PLAN: Attend aftercare/continuing care group Attend PHP/IOP Outpatient therapy Return to previous living arrangement  PATIENT/FAMIILY INVOLVEMENT: This treatment plan has been presented to and reviewed with the patient, Angie [name], and/or family member.  The patient and family have been given the opportunity to ask questions and make suggestions.  Angie [name] 06/04/2015, 3:59 PM
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
## 2015-06-05 MED ORDER — LORAZEPAM 1 MG PO TABS
1.0000 mg | ORAL_TABLET | Freq: Every day | ORAL | Status: AC
  Administered 2015-06-07: 1 mg via ORAL

## 2015-06-05 MED ORDER — ALBUTEROL SULFATE HFA 108 (90 BASE) MCG/ACT IN AERS
2.0000 | INHALATION_SPRAY | RESPIRATORY_TRACT | Status: DC | PRN
  Administered 2015-06-05: 2 via RESPIRATORY_TRACT
  Filled 2015-06-05: qty 6.7

## 2015-06-05 MED ORDER — LORAZEPAM 1 MG PO TABS: 1.0000 mg | ORAL_TABLET | Freq: Four times a day (QID) | ORAL | Status: AC | PRN

## 2015-06-05 MED ORDER — DIPHENHYDRAMINE HCL 50 MG/ML IJ SOLN
50.0000 mg | INTRAMUSCULAR | Status: AC
  Filled 2015-06-05: qty 1

## 2015-06-05 MED ORDER — VITAMIN B-1 100 MG PO TABS
100.0000 mg | ORAL_TABLET | Freq: Every day | ORAL | Status: DC
  Administered 2015-06-06 – 2015-06-08 (×3): 100 mg via ORAL
  Filled 2015-06-05 (×5): qty 1

## 2015-06-05 MED ORDER — EPINEPHRINE 0.3 MG/0.3ML IJ SOAJ: 0.3000 mg | Freq: Every day | INTRAMUSCULAR | Status: DC | PRN

## 2015-06-05 MED ORDER — LORAZEPAM 1 MG PO TABS
1.0000 mg | ORAL_TABLET | Freq: Three times a day (TID) | ORAL | Status: AC
  Administered 2015-06-06 (×3): 1 mg via ORAL
  Filled 2015-06-05 (×3): qty 1

## 2015-06-05 MED ORDER — LORAZEPAM 1 MG PO TABS
1.0000 mg | ORAL_TABLET | Freq: Two times a day (BID) | ORAL | Status: AC
  Administered 2015-06-07 (×2): 1 mg via ORAL
  Filled 2015-06-05 (×2): qty 1

## 2015-06-05 MED ORDER — THIAMINE HCL 100 MG/ML IJ SOLN
100.0000 mg | Freq: Once | INTRAMUSCULAR | Status: AC
  Administered 2015-06-05: 100 mg via INTRAMUSCULAR
  Filled 2015-06-05: qty 2

## 2015-06-05 MED ORDER — QUETIAPINE FUMARATE 100 MG PO TABS
100.0000 mg | ORAL_TABLET | Freq: Every day | ORAL | Status: DC
  Administered 2015-06-05 – 2015-06-07 (×3): 100 mg via ORAL
  Filled 2015-06-05 (×5): qty 1

## 2015-06-05 MED ORDER — DIPHENHYDRAMINE HCL 50 MG PO CAPS
50.0000 mg | ORAL_CAPSULE | ORAL | Status: AC
  Administered 2015-06-05: 50 mg via ORAL
  Filled 2015-06-05: qty 1
  Filled 2015-06-05: qty 2

## 2015-06-05 MED ORDER — LORAZEPAM 1 MG PO TABS
1.0000 mg | ORAL_TABLET | Freq: Four times a day (QID) | ORAL | Status: AC
  Administered 2015-06-05 (×3): 1 mg via ORAL
  Filled 2015-06-05 (×3): qty 1

## 2015-06-05 MED ORDER — FAMOTIDINE 40 MG PO TABS
80.0000 mg | ORAL_TABLET | ORAL | Status: AC
  Administered 2015-06-05: 80 mg via ORAL
  Filled 2015-06-05: qty 4
  Filled 2015-06-05: qty 2

## 2015-06-05 NOTE — Progress Notes (Signed)
Pt complained of reactions but was not sure she was reacted too, had rash to her chest and was itching. Benadryl 50 mg PO and Pepcid 80 mg PO given at 1106. Pt checked 30 min later and reported feeling better, we will continue to monitor.

## 2015-06-05 NOTE — Progress Notes (Signed)
NUTRITION ASSESSMENT  Pt identified as at risk on the Malnutrition Screen Tool  INTERVENTION: 1. Educated patient on the importance of nutrition and encouraged intake of food and beverages. 2. Discussed weight goals. 3. Supplements: continue Ensure Enlive BID, each supplement provides 350 kcal and 20 grams of protein  NUTRITION DIAGNOSIS: Inadequate oral intake related to lack of appetite PTA as evidenced by pt report.  Goal: Pt to meet >/= 90% of their estimated nutrition needs.  Monitor:  PO intake  Assessment:  Pt seen for MST. She was admitted for depression, SI, and detox, per her report. Per review, pt has had some weight fluctuations recently, but overall has gained weight in the past few months. Chart review indicates pt has gained 19 lbs in the past 6 months.  Ensure Enlive already ordered BID.  34 y.o. female  Height: Ht Readings from Last 1 Encounters:  06/04/15 5' 2.5" (1.588 m)    Weight: Wt Readings from Last 1 Encounters:  06/04/15 269 lb (122.018 kg)    Weight Hx: Wt Readings from Last 10 Encounters:  06/04/15 269 lb (122.018 kg)  06/03/15 269 lb (122.018 kg)  11/29/14 250 lb (113.399 kg)  05/31/14 240 lb (108.863 kg)  07/18/12 235 lb (106.595 kg)  07/08/12 235 lb (106.595 kg)  03/06/08 238 lb (107.956 kg)  01/04/08 239 lb (108.41 kg)  12/01/07 235 lb (106.595 kg)  10/25/07 233 lb (105.688 kg)    BMI:  Body mass index is 48.39 kg/(m^2). Pt meets criteria for morbid obesity based on current BMI.  Estimated Nutritional Needs: Kcal: 25-30 kcal/kg Protein: > 1 gram protein/kg Fluid: 1 ml/kcal  Diet Order: Diet regular Room service appropriate?: Yes; Fluid consistency:: Thin Pt is also offered choice of unit snacks mid-morning and mid-afternoon.  Pt is eating as desired.   Lab results and medications reviewed.      Trenton GammonJessica Ferrell Claiborne, RD, LDN Inpatient Clinical Dietitian Pager # [phone] After hours/weekend pager # [phone]

## 2015-06-05 NOTE — [facility] Group Notes (Signed)
[facility]   06/05/2015 10:29 AM  Participation Quality:  Appropriate   Mood/Affect:  Appropriate  Depression Rating:  5  Anxiety Rating:  7  Thoughts of Suicide:  No Will you contract for safety?   NA  Current AVH:  No  Plan for Discharge/Comments:  Pt reports that she is nervous and anxious still and is hoping for medication changes. Pt unsure if she can return home at this time. CSW encouraged pt to contact her inlaws to verify that she can return home. Dr. Evelene CroonKaur appt rescheduled and CSW assessing for other providers per pt request.   Transportation Means: family member   Supports: sister  Smart, Lebron QuamHeather LCSWA

## 2015-06-05 NOTE — Progress Notes (Signed)
D. Pt present with depressed and anxious mood in the unit. Complianed of anxiety due to due too much thinking. Vistaril 25 mg PO given, pt reported feeling better 30 min  Later. As per self inventory, pt had a fair sleep, fair appetite, low energy and good concentration. Her goal for today is " learning how to relax and to be calm." and to more interactive. Pt reported that her depression was a 5, her hopelessness was a 6, and that her anxiety was a 6. Pt reported being negative SI/HI, no AH/VH noted. A: 15 min checks continued for patient safety. R: Pts safety maintained.

## 2015-06-05 NOTE — Progress Notes (Signed)
[facility] MD Progress Note  06/05/2015 5:59 PM Angie [name]  MRN:  [id number] Subjective:  Not sure if she is going to be allowed back to where she was staying at with her ex in laws. States that they see her as a "junkie" if she was not able to go back she would have to consider going to [place]. If she goes there she is going to have a hard time seeing her kid who will be in [place] with her father Principal Problem: Substance-induced disorder [facility]) Diagnosis:   Patient Active Problem List   Diagnosis Date Noted  . PTSD (post-traumatic stress disorder) [F43.10] 06/04/2015  . Benzodiazepine dependence, continuous ([place]) [F13.20] 06/04/2015  . Substance-induced disorder ([place]) [F19.99] 06/04/2015  . Depression, major, recurrent, moderate ([place]) [F33.1] 06/04/2015  . Obstructive sleep apnea [G47.33] 07/08/2012  . Dizziness [R42]   . Morbid obesity with BMI of 40.0-44.9, adult ([place]) [E66.01, Z68.41]   . Anxiety [F41.9]   . Endometriosis [N80.9]   . Chronic bronchitis ([place]) [J42]   . ASTHMA [J45.909] 12/07/2007  . INSOMNIA [G47.00] 10/25/2007  . DEPRESSION [F32.9] 09/20/2007  . COMMON MIGRAINE [G43.009] 09/20/2007   Total Time spent with patient: 30 minutes  Past Psychiatric History: see admission H and P  Past Medical History:  Past Medical History  Diagnosis Date  . Dizziness   . Asthma   . Chronic bronchitis ([place])   . Endometriosis   . Anxiety   . Morbid obesity with BMI of 40.0-44.9, adult ([place])   . Abdominal pain   . DEPRESSION 09/20/2007    Qualifier: Diagnosis of  By: Maxie Better FNP, Rosalita Levan   . Anxiety   . PTSD (post-traumatic stress disorder)   . Panic attack     Past Surgical History  Procedure Laterality Date  . Total abdominal hysterectomy  May 2012    Right ovary still in  . Cholecystectomy  June 2011  . Cesarean section    . Salpingectomy  November 2004    Left sided, with adhesion lysis  . Tilt table study N/A 07/18/2012    Procedure: TILT TABLE STUDY;   Surgeon: Deboraha Sprang, MD;  Location: [facility] CATH LAB;  Service: Cardiovascular;  Laterality: N/A;   Family History:  Family History  Problem Relation Age of Onset  . Cancer Mother     colon cancer  . Heart failure Father     4 vessel bypass   Family Psychiatric  History: see admission H and P Social History:  History  Alcohol Use No     History  Drug Use No    Social History   Social History  . Marital Status: Divorced    Spouse Name: N/A  . Number of Children: N/A  . Years of Education: N/A   Social History Main Topics  . Smoking status: Current Every Day Smoker -- 0.00 packs/day for 0 years    Types: Cigarettes  . Smokeless tobacco: None  . Alcohol Use: No  . Drug Use: No  . Sexual Activity: Yes    Birth Control/ Protection: Surgical   Other Topics Concern  . None   Social History Narrative   Additional Social History:    Pain Medications: See PTA list Prescriptions: See PTA list Over the Counter: See PTA list History of alcohol / drug use?: Yes Longest period of sobriety (when/how long): unsure Negative Consequences of Use: Financial, Personal relationships Withdrawal Symptoms: Other (Comment) (no signs of withdrawal symptoms) Name of Substance 1: Alcohol 1 -  Age of First Use: 22 1 - Amount (size/oz): 1 glass/wine 1 - Frequency: 1 x month 1 - Duration: years 1 - Last Use / Amount: 1 month ago Name of Substance 2: Marijuana 2 - Age of First Use: 64s 2 - Amount (size/oz): "a couple of hits" 2 - Frequency: "every other weekend" 2 - Duration: years 2 - Last Use / Amount: "stopped several months ago" Name of Substance 3: Nicotine 3 - Age of First Use: 16 3 - Amount (size/oz): 1 pack of cigarettes 3 - Frequency: daily 3 - Last Use / Amount: today Name of Substance 4: Overuse of Xanax 4 - Amount (size/oz): taking an extra tablet 4 - Frequency: per night 4 - Last Use / Amount: 1 week ago            Sleep: Poor  Appetite:  Fair  Current  Medications: Current Facility-Administered Medications  Medication Dose Route Frequency Provider Last Rate Last Dose  . acetaminophen (TYLENOL) tablet 650 mg  650 mg Oral Q6H PRN Nicholaus Bloom, MD      . albuterol (PROVENTIL HFA;VENTOLIN HFA) 108 (90 BASE) MCG/ACT inhaler 2 puff  2 puff Inhalation Q4H PRN Benjamine Mola, FNP      . alum & mag hydroxide-simeth (MAALOX/MYLANTA) 200-200-20 MG/5ML suspension 30 mL  30 mL Oral Q4H PRN Nicholaus Bloom, MD      . ARIPiprazole (ABILIFY) tablet 5 mg  5 mg Oral QHS Nicholaus Bloom, MD   5 mg at 06/04/15 2130  . EPINEPHrine (EPI-PEN) injection 0.3 mg  0.3 mg Intramuscular Daily PRN Benjamine Mola, FNP      . feeding supplement (ENSURE ENLIVE) (ENSURE ENLIVE) liquid 237 mL  237 mL Oral BID BM Nicholaus Bloom, MD   237 mL at 06/05/15 0804  . FLUoxetine (PROZAC) capsule 40 mg  40 mg Oral Daily Nicholaus Bloom, MD   40 mg at 06/05/15 0804  . hydrOXYzine (ATARAX/VISTARIL) tablet 25 mg  25 mg Oral Q6H PRN Nicholaus Bloom, MD   25 mg at 06/05/15 0818  . loperamide (IMODIUM) capsule 2-4 mg  2-4 mg Oral PRN Nicholaus Bloom, MD      . LORazepam (ATIVAN) tablet 1 mg  1 mg Oral Q6H PRN Benjamine Mola, FNP      . LORazepam (ATIVAN) tablet 1 mg  1 mg Oral QID Benjamine Mola, FNP   1 mg at 06/05/15 1711   Followed by  . [START ON 06/06/2015] LORazepam (ATIVAN) tablet 1 mg  1 mg Oral TID Benjamine Mola, FNP       Followed by  . [START ON 06/07/2015] LORazepam (ATIVAN) tablet 1 mg  1 mg Oral BID Benjamine Mola, FNP       Followed by  . [START ON 06/08/2015] LORazepam (ATIVAN) tablet 1 mg  1 mg Oral Daily John C Withrow, FNP      . magnesium hydroxide (MILK OF MAGNESIA) suspension 30 mL  30 mL Oral Daily PRN Nicholaus Bloom, MD      . multivitamin with minerals tablet 1 tablet  1 tablet Oral Daily Nicholaus Bloom, MD   1 tablet at 06/05/15 0804  . nicotine polacrilex (NICORETTE) gum 2 mg  2 mg Oral PRN Nicholaus Bloom, MD   2 mg at 06/05/15 1302  . ondansetron (ZOFRAN-ODT) disintegrating  tablet 4 mg  4 mg Oral Q6H PRN Nicholaus Bloom, MD   4 mg at 06/04/15 1428  .  QUEtiapine (SEROQUEL) tablet 100 mg  100 mg Oral QHS Nicholaus Bloom, MD      . Derrill Memo ON 06/06/2015] thiamine (VITAMIN B-1) tablet 100 mg  100 mg Oral Daily Benjamine Mola, FNP      . zolpidem (AMBIEN) tablet 10 mg  10 mg Oral QHS PRN Nicholaus Bloom, MD   10 mg at 06/04/15 2130    Lab Results:  Results for orders placed or performed during the hospital encounter of 06/03/15 (from the past 48 hour(s))  Urine rapid drug screen (hosp performed)     Status: Abnormal   Collection Time: 06/03/15  9:10 PM  Result Value Ref Range   Opiates NONE DETECTED NONE DETECTED   Cocaine NONE DETECTED NONE DETECTED   Benzodiazepines POSITIVE (A) NONE DETECTED   Amphetamines NONE DETECTED NONE DETECTED   Tetrahydrocannabinol NONE DETECTED NONE DETECTED   Barbiturates NONE DETECTED NONE DETECTED    Comment:        DRUG SCREEN FOR MEDICAL PURPOSES ONLY.  IF CONFIRMATION IS NEEDED FOR ANY PURPOSE, NOTIFY LAB WITHIN 5 DAYS.        LOWEST DETECTABLE LIMITS FOR URINE DRUG SCREEN Drug Class       Cutoff (ng/mL) Amphetamine      1000 Barbiturate      200 Benzodiazepine   761 Tricyclics       950 Opiates          300 Cocaine          300 THC              50   Ethanol     Status: None   Collection Time: 06/03/15  9:48 PM  Result Value Ref Range   Alcohol, Ethyl (B) <5 <5 mg/dL    Comment:        LOWEST DETECTABLE LIMIT FOR SERUM ALCOHOL IS 5 mg/dL FOR MEDICAL PURPOSES ONLY   CBC with Differential     Status: None   Collection Time: 06/03/15  9:48 PM  Result Value Ref Range   WBC 7.4 4.0 - 10.5 K/uL   RBC 4.22 3.87 - 5.11 MIL/uL   Hemoglobin 12.8 12.0 - 15.0 g/dL   HCT 38.9 36.0 - 46.0 %   MCV 92.2 78.0 - 100.0 fL   MCH 30.3 26.0 - 34.0 pg   MCHC 32.9 30.0 - 36.0 g/dL   RDW 14.0 11.5 - 15.5 %   Platelets 302 150 - 400 K/uL   Neutrophils Relative % 62 %   Neutro Abs 4.7 1.7 - 7.7 K/uL   Lymphocytes Relative 27 %    Lymphs Abs 2.0 0.7 - 4.0 K/uL   Monocytes Relative 7 %   Monocytes Absolute 0.5 0.1 - 1.0 K/uL   Eosinophils Relative 4 %   Eosinophils Absolute 0.3 0.0 - 0.7 K/uL   Basophils Relative 0 %   Basophils Absolute 0.0 0.0 - 0.1 K/uL  Comprehensive metabolic panel     Status: Abnormal   Collection Time: 06/03/15  9:48 PM  Result Value Ref Range   Sodium 138 135 - 145 mmol/L   Potassium 3.4 (L) 3.5 - 5.1 mmol/L   Chloride 104 101 - 111 mmol/L   CO2 25 22 - 32 mmol/L   Glucose, Bld 89 65 - 99 mg/dL   BUN 11 6 - 20 mg/dL   Creatinine, Ser 0.73 0.44 - 1.00 mg/dL   Calcium 9.3 8.9 - 10.3 mg/dL   Total Protein 6.2 (L) 6.5 - 8.1  g/dL   Albumin 3.7 3.5 - 5.0 g/dL   AST 21 15 - 41 U/L   ALT 27 14 - 54 U/L   Alkaline Phosphatase 56 38 - 126 U/L   Total Bilirubin 0.4 0.3 - 1.2 mg/dL   GFR calc non Af Amer >60 >60 mL/min   GFR calc Af Amer >60 >60 mL/min    Comment: (NOTE) The eGFR has been calculated using the CKD EPI equation. This calculation has not been validated in all clinical situations. eGFR's persistently <60 mL/min signify possible Chronic Kidney Disease.    Anion gap 9 5 - 15  CBG monitoring, ED     Status: None   Collection Time: 06/04/15 12:26 AM  Result Value Ref Range   Glucose-Capillary 93 65 - 99 mg/dL    Physical Findings: AIMS: Facial and Oral Movements Muscles of Facial Expression: None, normal Lips and Perioral Area: None, normal Jaw: None, normal Tongue: None, normal,Extremity Movements Upper (arms, wrists, hands, fingers): None, normal Lower (legs, knees, ankles, toes): None, normal, Trunk Movements Neck, shoulders, hips: None, normal, Overall Severity Severity of abnormal movements (highest score from questions above): None, normal Incapacitation due to abnormal movements: None, normal Patient's awareness of abnormal movements (rate only patient's report): No Awareness, Dental Status Current problems with teeth and/or dentures?: Yes (two upper front teeth  missing) Does patient usually wear dentures?: No  CIWA:  CIWA-Ar Total: 2 COWS:  COWS Total Score: 0  Musculoskeletal: Strength & Muscle Tone: within normal limits Gait & Station: normal Patient leans: normal  Psychiatric Specialty Exam: Review of Systems  Constitutional: Negative.   HENT: Negative.   Eyes: Negative.   Respiratory: Negative.   Cardiovascular: Negative.   Gastrointestinal: Negative.   Genitourinary: Negative.   Musculoskeletal: Negative.   Skin: Negative.   Neurological: Negative.   Endo/Heme/Allergies: Negative.   Psychiatric/Behavioral: Positive for depression and substance abuse. The patient is nervous/anxious.     Blood pressure 100/81, pulse 86, temperature 98.2 F (36.8 C), temperature source Oral, resp. rate 18, height 5' 2.5" (1.588 m), weight 122.018 kg (269 lb), last menstrual period 09/19/2010, SpO2 100 %.Body mass index is 48.39 kg/(m^2).  General Appearance: Fairly Groomed  Engineer, water::  Fair  Speech:  Clear and Coherent and Slow  Volume:  Decreased  Mood:  Anxious, Depressed and worried  Affect:  Restricted  Thought Process:  Coherent and Goal Directed  Orientation:  Full (Time, Place, and Person)  Thought Content:  symptoms events worries concerns  Suicidal Thoughts:  No  Homicidal Thoughts:  No  Memory:  Immediate;   Fair Recent;   Fair Remote;   Fair  Judgement:  Fair  Insight:  Present  Psychomotor Activity:  Decreased  Concentration:  Fair  Recall:  [employer] of Knowledge:Fair  Language: Fair  Akathisia:  No  Handed:  Right  AIMS (if indicated):     Assets:  Desire for Improvement  ADL's:  Intact  Cognition: WNL  Sleep:  Number of Hours: 6.75   Treatment Plan Summary: Daily contact with patient to assess and evaluate symptoms and progress in treatment and Medication management Supportive approach/coping skills Benzodiazepine dependence; continue the Ativan detox ( she was allergic to Librium) work a relapse prevention  plan Depression: Continue the Prozac 40 mg daily with Abilify augmentation Insomnia; will start Seroquel 100 mg HS, she has been using up to 150 mg HS, continue the Ambien Anxiety; use CBT/mindfulness Nephtali Docken A 06/05/2015, 5:59 PM

## 2015-06-05 NOTE — Progress Notes (Signed)
Pt attended the evening NA speaker meeting. Pt was engaged and attentive. 

## 2015-06-05 NOTE — [facility] Suicide Risk Assessment (Signed)
[facility] INPATIENT:  Family/Significant Other Suicide Prevention Education  Suicide Prevention Education:  Contact Attempts: Georgia MileCypruss (pt's sister) [phone] has been identified by the patient as the family member/significant other with whom the patient will be residing, and identified as the person(s) who will aid the patient in the event of a mental health crisis.  With written consent from the patient, two attempts were made to provide suicide prevention education, prior to and/or following the patient's discharge.  We were unsuccessful in providing suicide prevention education.  A suicide education pamphlet was given to the patient to share with family/significant other.  Date and time of first attempt: 06/05/15 at 9:30AM Date and time of second attempt: 06/05/15 at 3:38PM (voicemail left requesting call back at her earliest convenience).   Smart, Makhayla Mcmurry LCSWA  06/05/2015, 3:38 PM
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
## 2015-06-05 NOTE — Plan of Care (Signed)
Problem: Diagnosis: Increased Risk For Suicide Attempt Goal: LTG-Patient Will Report Improved Mood and Deny Suicidal LTG (by discharge) Patient will report improved mood and deny suicidal ideation.  Outcome: Progressing Pt denied SI and contracted for safety.     

## 2015-06-06 MED ORDER — ADULT MULTIVITAMIN W/MINERALS CH: 1.0000 | ORAL_TABLET | Freq: Every day | ORAL | Status: AC

## 2015-06-06 MED ORDER — FLUOXETINE HCL 40 MG PO CAPS: 40.0000 mg | ORAL_CAPSULE | Freq: Every day | ORAL | Status: AC

## 2015-06-06 MED ORDER — THIAMINE HCL 100 MG PO TABS: 100.0000 mg | ORAL_TABLET | Freq: Every day | ORAL | Status: AC

## 2015-06-06 MED ORDER — EPINEPHRINE 0.3 MG/0.3ML IJ SOAJ: 0.3000 mg | Freq: Every day | INTRAMUSCULAR | Status: AC | PRN

## 2015-06-06 MED ORDER — QUETIAPINE FUMARATE 100 MG PO TABS: 100.0000 mg | ORAL_TABLET | Freq: Every day | ORAL | Status: AC

## 2015-06-06 MED ORDER — ARIPIPRAZOLE 5 MG PO TABS: 5.0000 mg | ORAL_TABLET | Freq: Every day | ORAL | Status: AC

## 2015-06-06 NOTE — Plan of Care (Signed)
Problem: Alteration in mood Goal: LTG-Pt's behavior demonstrates decreased signs of depression (Patient's behavior demonstrates decreased signs of depression to the point the patient is safe to return home and continue treatment in an outpatient setting)  Outcome: Progressing Patient reports feeling slightly better today than she did yesterday     

## 2015-06-06 NOTE — Progress Notes (Signed)
D:  Patient was alert and oriented on the unit this shift.  Patient reports her sleep was fair last night with sleep medication, her appetite was fair and her concentration was good.  Patient reports her energy level today is low.  Patient appears depressed and slightly anxious this shift.  Patient attended and actively participated in group this am.  Patient states she would like to work on having "increased energy" today.  Patient denies suicidal ideation, homicidal ideation, auditory or visual hallucinations at the current time.  A:  Scheduled medications were administered to patient per MD orders.  Emotional support and encouragement were provided.  Patient was maintained on q.15 minute safety checks.  Patient was informed to notify staff with any questions or concerns. R:  No adverse medication reactions were noted.  Patient was cooperative with medication administration and treatment plan this shift.  Patient is receptive, calm and cooperative at this time.  Patient interacts minimally with others on the unit this shift.  Patient contracts for safety on the unit at this time.  Patient remains safe at this time.

## 2015-06-06 NOTE — Plan of Care (Signed)
Problem: Alteration in mood & ability to function due to Goal: STG-Patient will report withdrawal symptoms Outcome: Progressing Pt reports she is having mild to moderate withdrawal symptoms tonight.

## 2015-06-06 NOTE — Progress Notes (Signed)
Pt reports that she has had a good day.  She is glad to get back on her medications.  She says she is having mild to moderate withdrawal symptoms, but the medications are helping.  Pt denies SI/HI/AVH.  She is unsure where she will be going after detox.  Pt makes her needs known to staff.  Pt did take the time to come to writer after an incident with another patient to thank Clinical research associatewriter for being so nice and said that staff has been nice and helpful to her.   Support and encouragement offered.  Safety maintained with q15 minute checks.

## 2015-06-06 NOTE — Plan of Care (Signed)
Problem: Alteration in mood Goal: LTG-Patient reports reduction in suicidal thoughts (Patient reports reduction in suicidal thoughts and is able to verbalize a safety plan for whenever patient is feeling suicidal)  Outcome: Progressing Patient reports no suicidal ideation today.
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
## 2015-06-06 NOTE — Progress Notes (Addendum)
  [facility] Discharge Plan :  Will you be returning to the same living situation after discharge:  No, patient plans to stay with a friend At discharge, do you have transportation home?: Yes,  patient reports access to transportation by friend Do you have the ability to pay for your medications: Yes,  patient will be provided with prescriptions  Release of information consent forms completed and in the chart;  Patient's signature needed at discharge.  Patient to Follow up at: Follow-up Information    [facility] On 06/11/2015.   Why:  Your appt has been rescheduled for this date at 6:30PM for medication management/hospital follow-up with Dr. Evelene CroonKaur. Please request counselor if interested during this session.    Contact information:   ATTN: Dr. Milagros Evenerupinder Kaur [address] [place], [place] [address] Phone: [phone] Fax: [phone]      Patient denies SI/HI: Yes,  denies    Safety Planning and Suicide Prevention discussed: Yes,  with patient   Have you used any form of tobacco in the last 30 days? (Cigarettes, Smokeless Tobacco, Cigars, and/or Pipes): Yes  Has patient been referred to the Quitline?: Patient refused referral  Janisa Labus, [place] CarboKristin L 06/06/2015, 9:51 AM

## 2015-06-06 NOTE — Progress Notes (Signed)
[facility]  06/06/2015 2:47 PM [name]  MRN:  [id number] Subjective:  Angie SagoSarah states she is still experiencing anxiety. Worry. She has not heard from her ex in laws so she does not know if she is going to be able to go back there. And if she cant she does not have any other options than going to [place]. This will have her away from her daughter. States that she does not know how she is going to be able to handle that Principal Problem: Substance-induced disorder [facility](HCC) Diagnosis:   Patient Active Problem List   Diagnosis Date Noted  . PTSD (post-traumatic stress disorder) [F43.10] 06/04/2015  . Benzodiazepine dependence, continuous (HCC) [F13.20] 06/04/2015  . Substance-induced disorder (HCC) [F19.99] 06/04/2015  . Depression, major, recurrent, moderate (HCC) [F33.1] 06/04/2015  . Obstructive sleep apnea [G47.33] 07/08/2012  . Dizziness [R42]   . Morbid obesity with BMI of 40.0-44.9, adult (HCC) [E66.01, Z68.41]   . Anxiety [F41.9]   . Endometriosis [N80.9]   . Chronic bronchitis (HCC) [J42]   . ASTHMA [J45.909] 12/07/2007  . INSOMNIA [G47.00] 10/25/2007  . DEPRESSION [F32.9] 09/20/2007  . COMMON MIGRAINE [G43.009] 09/20/2007   Total Time spent with patient: 30 minutes  Past Psychiatric History: see Admission H and P  Past Medical History:  Past Medical History  Diagnosis Date  . Dizziness   . Asthma   . Chronic bronchitis (HCC)   . Endometriosis   . Anxiety   . Morbid obesity with BMI of 40.0-44.9, adult (HCC)   . Abdominal pain   . DEPRESSION 09/20/2007    Qualifier: Diagnosis of  By: Jillyn HiddenBean FNP, Mcarthur RossettiBillie-Lynn Daniels   . Anxiety   . PTSD (post-traumatic stress disorder)   . Panic attack     Past Surgical History  Procedure Laterality Date  . Total abdominal hysterectomy  May 2012    Right ovary still in  . Cholecystectomy  June 2011  . Cesarean section    . Salpingectomy  November 2004    Left sided, with adhesion lysis  . Tilt table study N/A  07/18/2012    Procedure: TILT TABLE STUDY;  Surgeon: Duke SalviaSteven C Klein, MD;  [facility];  Service: Cardiovascular;  Laterality: N/A;   Family History:  Family History  Problem Relation Age of Onset  . Cancer Mother     colon cancer  . Heart failure Father     4 vessel bypass   Family Psychiatric  History: see Admission H and P Social History:  History  Alcohol Use No     History  Drug Use No    Social History   Social History  . Marital Status: Divorced    Spouse Name: N/A  . Number of Children: N/A  . Years of Education: N/A   Social History Main Topics  . Smoking status: Current Every Day Smoker -- 0.00 packs/day for 0 years    Types: Cigarettes  . Smokeless tobacco: None  . Alcohol Use: No  . Drug Use: No  . Sexual Activity: Yes    Birth Control/ Protection: Surgical   Other Topics Concern  . None   Social History Narrative   Additional Social History:    Pain Medications: See PTA list Prescriptions: See PTA list Over the Counter: See PTA list History of alcohol / drug use?: Yes Longest period of sobriety (when/how long): unsure Negative Consequences of Use: Financial, Personal relationships Withdrawal Symptoms: Other (Comment) (no signs of withdrawal symptoms) Name of Substance  1: Alcohol 1 - Age of First Use: 22 1 - Amount (size/oz): 1 glass/wine 1 - Frequency: 1 x month 1 - Duration: years 1 - Last Use / Amount: 1 month ago Name of Substance 2: Marijuana 2 - Age of First Use: 30s 2 - Amount (size/oz): "a couple of hits" 2 - Frequency: "every other weekend" 2 - Duration: years 2 - Last Use / Amount: "stopped several months ago" Name of Substance 3: Nicotine 3 - Age of First Use: 16 3 - Amount (size/oz): 1 pack of cigarettes 3 - Frequency: daily 3 - Last Use / Amount: today Name of Substance 4: Overuse of Xanax 4 - Amount (size/oz): taking an extra tablet 4 - Frequency: per night 4 - Last Use / Amount: 1 week ago             Sleep: Fair  Appetite:  Fair  Current Medications: Current Facility-Administered Medications  Medication Dose Route Frequency Provider Last Rate Last Dose  . acetaminophen (TYLENOL) tablet 650 mg  650 mg Oral Q6H PRN Rachael Fee, MD   650 mg at 06/06/15 0981  . albuterol (PROVENTIL HFA;VENTOLIN HFA) 108 (90 BASE) MCG/ACT inhaler 2 puff  2 puff Inhalation Q4H PRN Beau Fanny, FNP   2 puff at 06/05/15 2129  . alum & mag hydroxide-simeth (MAALOX/MYLANTA) 200-200-20 MG/5ML suspension 30 mL  30 mL Oral Q4H PRN Rachael Fee, MD      . ARIPiprazole (ABILIFY) tablet 5 mg  5 mg Oral QHS Rachael Fee, MD   5 mg at 06/05/15 2127  . EPINEPHrine (EPI-PEN) injection 0.3 mg  0.3 mg Intramuscular Daily PRN Beau Fanny, FNP      . feeding supplement (ENSURE ENLIVE) (ENSURE ENLIVE) liquid 237 mL  237 mL Oral BID BM Rachael Fee, MD   237 mL at 06/06/15 0759  . FLUoxetine (PROZAC) capsule 40 mg  40 mg Oral Daily Rachael Fee, MD   40 mg at 06/06/15 0756  . hydrOXYzine (ATARAX/VISTARIL) tablet 25 mg  25 mg Oral Q6H PRN Rachael Fee, MD   25 mg at 06/05/15 0818  . loperamide (IMODIUM) capsule 2-4 mg  2-4 mg Oral PRN Rachael Fee, MD      . LORazepam (ATIVAN) tablet 1 mg  1 mg Oral Q6H PRN Beau Fanny, FNP      . LORazepam (ATIVAN) tablet 1 mg  1 mg Oral TID Beau Fanny, FNP   1 mg at 06/06/15 1159   Followed by  . [START ON 06/07/2015] LORazepam (ATIVAN) tablet 1 mg  1 mg Oral BID Beau Fanny, FNP       Followed by  . [START ON 06/08/2015] LORazepam (ATIVAN) tablet 1 mg  1 mg Oral Daily John C Withrow, FNP      . magnesium hydroxide (MILK OF MAGNESIA) suspension 30 mL  30 mL Oral Daily PRN Rachael Fee, MD      . multivitamin with minerals tablet 1 tablet  1 tablet Oral Daily Rachael Fee, MD   1 tablet at 06/06/15 0756  . nicotine polacrilex (NICORETTE) gum 2 mg  2 mg Oral PRN Rachael Fee, MD   2 mg at 06/05/15 1302  . ondansetron (ZOFRAN-ODT) disintegrating tablet 4 mg  4 mg Oral  Q6H PRN Rachael Fee, MD   4 mg at 06/04/15 1428  . QUEtiapine (SEROQUEL) tablet 100 mg  100 mg Oral QHS Rachael Fee, MD  100 mg at 06/05/15 2127  . thiamine (VITAMIN B-1) tablet 100 mg  100 mg Oral Daily Beau Fanny, FNP   100 mg at 06/06/15 1610  . zolpidem (AMBIEN) tablet 10 mg  10 mg Oral QHS PRN Rachael Fee, MD   10 mg at 06/05/15 2127    Lab Results: No results found for this or any previous visit (from the past 48 hour(s)).  Physical Findings: AIMS: Facial and Oral Movements Muscles of Facial Expression: None, normal Lips and Perioral Area: None, normal Jaw: None, normal Tongue: None, normal,Extremity Movements Upper (arms, wrists, hands, fingers): None, normal Lower (legs, knees, ankles, toes): None, normal, Trunk Movements Neck, shoulders, hips: None, normal, Overall Severity Severity of abnormal movements (highest score from questions above): None, normal Incapacitation due to abnormal movements: None, normal Patient's awareness of abnormal movements (rate only patient's report): No Awareness, Dental Status Current problems with teeth and/or dentures?: Yes Does patient usually wear dentures?: No  CIWA:  CIWA-Ar Total: 3 COWS:  COWS Total Score: 0  Musculoskeletal: Strength & Muscle Tone: within normal limits Gait & Station: normal Patient leans: normal  Psychiatric Specialty Exam: Review of Systems  Constitutional: Negative.   HENT: Negative.   Eyes: Negative.   Respiratory: Negative.   Cardiovascular: Negative.   Gastrointestinal: Negative.   Genitourinary: Negative.   Musculoskeletal: Negative.   Skin: Negative.   Neurological: Negative.   Endo/Heme/Allergies: Negative.   Psychiatric/Behavioral: Positive for depression and substance abuse. The patient is nervous/anxious.     Blood pressure 106/74, pulse 108, temperature 98 F (36.7 C), temperature source Oral, resp. rate 18, height 5' 2.5" (1.588 m), weight 122.018 kg (269 lb), last menstrual period  09/19/2010, SpO2 100 %.Body mass index is 48.39 kg/(m^2).  General Appearance: Fairly Groomed  Patent attorney::  Fair  Speech:  Clear and Coherent and Slow  Volume:  Decreased  Mood:  Anxious, Depressed and worried  Affect:  anxious worried  Thought Process:  Coherent and Goal Directed  Orientation:  Full (Time, Place, and Person)  Thought Content:  symptoms events worries concerns  Suicidal Thoughts:  No  Homicidal Thoughts:  No  Memory:  Immediate;   Fair Recent;   Fair Remote;   Fair  Judgement:  Fair  Insight:  Present and Shallow  Psychomotor Activity:  Decreased  Concentration:  Fair  Recall:  [employer] of Knowledge:Fair  Language: Fair  Akathisia:  No  Handed:  Right  AIMS (if indicated):     Assets:  Desire for Improvement  ADL's:  Intact  Cognition: WNL  Sleep:  Number of Hours: 6.75   Treatment Plan Summary: Daily contact with patient to assess and evaluate symptoms and progress in treatment and Medication management Supportive approach/coping skills Depression; continue the Prozac 40 mg with the Abilify 5 mg Benzodiazepine dependence; continue the Ativan detox protocol/work a relapse prevention plan Mood instability: continue the Seroquel 100 mg HS Anxiety; continue to work with CBT/mindfulness Insomnia; continue the Ambien 10 and the Seroquel 100  Kathrina Crosley A 06/06/2015, 2:47 PM

## 2015-06-06 NOTE — [facility] Group Notes (Signed)
Child/Adolescent Psychoeducational Group Note  Date:  06/06/2015 Time:  0900  Group Topic/Focus:  Self Care:   The focus of this group is to help patients understand the importance of self-care in order to improve or restore emotional, physical, spiritual, interpersonal, and financial health.  Participation Level:  Active  Participation Quality:  Attentive  Affect:  Depressed  Cognitive:  Appropriate and Oriented  Insight:  Good  Engagement in Group:  Limited  Modes of Intervention:  Discussion and Exploration  Additional Comments:  Patient was attentive and minimally active in group, answering questions as appropriate.  Moshe SalisburyJennifer L Rozalynn Buege 06/06/2015, 10:08 AM

## 2015-06-07 NOTE — Progress Notes (Signed)
[facility]  06/07/2015 6:03 PM Angie [name]  MRN:  [id number] Subjective:  Angie [name] had been very upset, worried tearful earlier. She is coming off the Xanax and she did not have a place to go. The ex in laws seem do not want her back. She later found out that a friend is going to allow her to stay with her at least for the next 2 weeks. She is grateful but she is already worrying of what is she going to do after the 2 weeks Principal Problem: Substance-induced disorder [facility](HCC) Diagnosis:   Patient Active Problem List   Diagnosis Date Noted  . PTSD (post-traumatic stress disorder) [F43.10] 06/04/2015  . Benzodiazepine dependence, continuous (HCC) [F13.20] 06/04/2015  . Substance-induced disorder (HCC) [F19.99] 06/04/2015  . Depression, major, recurrent, moderate (HCC) [F33.1] 06/04/2015  . Obstructive sleep apnea [G47.33] 07/08/2012  . Dizziness [R42]   . Morbid obesity with BMI of 40.0-44.9, adult (HCC) [E66.01, Z68.41]   . Anxiety [F41.9]   . Endometriosis [N80.9]   . Chronic bronchitis (HCC) [J42]   . ASTHMA [J45.909] 12/07/2007  . INSOMNIA [G47.00] 10/25/2007  . DEPRESSION [F32.9] 09/20/2007  . COMMON MIGRAINE [G43.009] 09/20/2007   Total Time spent with patient: 30 minutes  Past Psychiatric History: see admission H and P  Past Medical History:  Past Medical History  Diagnosis Date  . Dizziness   . Asthma   . Chronic bronchitis (HCC)   . Endometriosis   . Anxiety   . Morbid obesity with BMI of 40.0-44.9, adult (HCC)   . Abdominal pain   . DEPRESSION 09/20/2007    Qualifier: Diagnosis of  By: Jillyn HiddenBean FNP, Mcarthur RossettiBillie-Lynn Daniels   . Anxiety   . PTSD (post-traumatic stress disorder)   . Panic attack     Past Surgical History  Procedure Laterality Date  . Total abdominal hysterectomy  May 2012    Right ovary still in  . Cholecystectomy  June 2011  . Cesarean section    . Salpingectomy  November 2004    Left sided, with adhesion lysis  . Tilt table study N/A  07/18/2012    Procedure: TILT TABLE STUDY;  Surgeon: Duke SalviaSteven C Klein, MD;  [facility];  Service: Cardiovascular;  Laterality: N/A;   Family History:  Family History  Problem Relation Age of Onset  . Cancer Mother     colon cancer  . Heart failure Father     4 vessel bypass   Family Psychiatric  History: see admission H and P Social History:  History  Alcohol Use No     History  Drug Use No    Social History   Social History  . Marital Status: Divorced    Spouse Name: N/A  . Number of Children: N/A  . Years of Education: N/A   Social History Main Topics  . Smoking status: Current Every Day Smoker -- 0.00 packs/day for 0 years    Types: Cigarettes  . Smokeless tobacco: None  . Alcohol Use: No  . Drug Use: No  . Sexual Activity: Yes    Birth Control/ Protection: Surgical   Other Topics Concern  . None   Social History Narrative   Additional Social History:    Pain Medications: See PTA list Prescriptions: See PTA list Over the Counter: See PTA list History of alcohol / drug use?: Yes Longest period of sobriety (when/how long): unsure Negative Consequences of Use: Financial, Personal relationships Withdrawal Symptoms: Other (Comment) (no signs of withdrawal symptoms)  Name of Substance 1: Alcohol 1 - Age of First Use: 22 1 - Amount (size/oz): 1 glass/wine 1 - Frequency: 1 x month 1 - Duration: years 1 - Last Use / Amount: 1 month ago Name of Substance 2: Marijuana 2 - Age of First Use: 30s 2 - Amount (size/oz): "a couple of hits" 2 - Frequency: "every other weekend" 2 - Duration: years 2 - Last Use / Amount: "stopped several months ago" Name of Substance 3: Nicotine 3 - Age of First Use: 16 3 - Amount (size/oz): 1 pack of cigarettes 3 - Frequency: daily 3 - Last Use / Amount: today Name of Substance 4: Overuse of Xanax 4 - Amount (size/oz): taking an extra tablet 4 - Frequency: per night 4 - Last Use / Amount: 1 week ago             Sleep: Fair  Appetite:  Fair  Current Medications: Current Facility-Administered Medications  Medication Dose Route Frequency Provider Last Rate Last Dose  . acetaminophen (TYLENOL) tablet 650 mg  650 mg Oral Q6H PRN Rachael Fee, MD   650 mg at 06/06/15 4010  . albuterol (PROVENTIL HFA;VENTOLIN HFA) 108 (90 BASE) MCG/ACT inhaler 2 puff  2 puff Inhalation Q4H PRN Beau Fanny, FNP   2 puff at 06/05/15 2129  . alum & mag hydroxide-simeth (MAALOX/MYLANTA) 200-200-20 MG/5ML suspension 30 mL  30 mL Oral Q4H PRN Rachael Fee, MD      . ARIPiprazole (ABILIFY) tablet 5 mg  5 mg Oral QHS Rachael Fee, MD   5 mg at 06/06/15 2140  . EPINEPHrine (EPI-PEN) injection 0.3 mg  0.3 mg Intramuscular Daily PRN Beau Fanny, FNP      . feeding supplement (ENSURE ENLIVE) (ENSURE ENLIVE) liquid 237 mL  237 mL Oral BID BM Rachael Fee, MD   237 mL at 06/07/15 1603  . FLUoxetine (PROZAC) capsule 40 mg  40 mg Oral Daily Rachael Fee, MD   40 mg at 06/07/15 0824  . LORazepam (ATIVAN) tablet 1 mg  1 mg Oral Q6H PRN Beau Fanny, FNP      . magnesium hydroxide (MILK OF MAGNESIA) suspension 30 mL  30 mL Oral Daily PRN Rachael Fee, MD      . multivitamin with minerals tablet 1 tablet  1 tablet Oral Daily Rachael Fee, MD   1 tablet at 06/07/15 2725  . nicotine polacrilex (NICORETTE) gum 2 mg  2 mg Oral PRN Rachael Fee, MD   2 mg at 06/07/15 1007  . QUEtiapine (SEROQUEL) tablet 100 mg  100 mg Oral QHS Rachael Fee, MD   100 mg at 06/06/15 2140  . thiamine (VITAMIN B-1) tablet 100 mg  100 mg Oral Daily Beau Fanny, FNP   100 mg at 06/07/15 3664  . zolpidem (AMBIEN) tablet 10 mg  10 mg Oral QHS PRN Rachael Fee, MD   10 mg at 06/06/15 2140    Lab Results: No results found for this or any previous visit (from the past 48 hour(s)).  Physical Findings: AIMS: Facial and Oral Movements Muscles of Facial Expression: None, normal Lips and Perioral Area: None, normal Jaw: None, normal Tongue: None,  normal,Extremity Movements Upper (arms, wrists, hands, fingers): None, normal Lower (legs, knees, ankles, toes): None, normal, Trunk Movements Neck, shoulders, hips: None, normal, Overall Severity Severity of abnormal movements (highest score from questions above): None, normal Incapacitation due to abnormal movements: None, normal Patient's awareness  of abnormal movements (rate only patient's report): No Awareness, Dental Status Current problems with teeth and/or dentures?: Yes (Missing and broken teeth.) Does patient usually wear dentures?: No  CIWA:  CIWA-Ar Total: 3 COWS:  COWS Total Score: 0  Musculoskeletal: Strength & Muscle Tone: within normal limits Gait & Station: normal Patient leans: normal  Psychiatric Specialty Exam: Review of Systems  Constitutional: Negative.   HENT: Negative.   Eyes: Negative.   Respiratory: Negative.   Cardiovascular: Negative.   Gastrointestinal: Negative.   Genitourinary: Negative.   Musculoskeletal: Negative.   Skin: Negative.   Neurological: Negative.   Endo/Heme/Allergies: Negative.   Psychiatric/Behavioral: Positive for depression. The patient is nervous/anxious.     Blood pressure 107/69, pulse 113, temperature 98.1 F (36.7 C), temperature source Oral, resp. rate 18, height 5' 2.5" (1.588 m), weight 122.018 kg (269 lb), last menstrual period 09/19/2010, SpO2 100 %.Body mass index is 48.39 kg/(m^2).  General Appearance: Fairly Groomed  Patent attorney::  Fair  Speech:  Clear and Coherent  Volume:  Decreased  Mood:  Anxious, Depressed and worried  Affect:  Depressed and worried  Thought Process:  Coherent and Goal Directed  Orientation:  Full (Time, Place, and Person)  Thought Content:  symptoms events worries concerns  Suicidal Thoughts:  No  Homicidal Thoughts:  No  Memory:  Immediate;   Fair Recent;   Fair Remote;   Fair  Judgement:  Fair  Insight:  Present  Psychomotor Activity:  Decreased  Concentration:  Fair  Recall:   [employer] of Knowledge:Fair  Language: Fair  Akathisia:  No  Handed:  Right  AIMS (if indicated):     Assets:  Desire for Improvement  ADL's:  Intact  Cognition: WNL  Sleep:  Number of Hours: 6   Treatment Plan Summary: Daily contact with patient to assess and evaluate symptoms and progress in treatment and Medication management Supportive approach/coping skills Benzodiazepine dependence; complete the Ativan detox protocol/work a relapse prevention plan Depression; continue the Prozac augmented with the Abilify 10 mg Mood instability/insomnia; continue the Seroquel 100 mg HS Use CBT/mindfulness/challenge  the catastrophic thinking and foretelling. Encourage to stay on the day by day. Concentrate on the gratitude for her friend who is allowing  her to stay the next two weeks with her. Support her communicating with the family so she can be allowed to move her things out of their house Bentzion Dauria A 06/07/2015, 6:03 PM

## 2015-06-07 NOTE — [facility] Group Notes (Signed)
[facility] LCSW Group Therapy 06/07/2015 1:15 PM Type of Therapy: Group Therapy Participation Level: Active  Participation Quality: Attentive, Sharing and Supportive  Affect: Depressed and tearful  Cognitive: Alert and Oriented  Insight: Developing/Improving and Engaged  Engagement in Therapy: Developing/Improving and Engaged  Modes of Intervention: Clarification, Confrontation, Discussion, Education, Exploration, Limit-setting, Orientation, Problem-solving, Rapport Building, Dance movement psychotherapisteality Testing, Socialization and Support  Summary of Progress/Problems: The topic for today was feelings about relapse. Pt discussed what relapse prevention is to them and identified triggers that they are on the path to relapse. Pt processed their feeling towards relapse and was able to relate to peers. Pt discussed coping skills that can be used for relapse prevention. Patient was tearful in group, discussed how her family is unwilling to help her with housing. CSW and other group members provided patient with emotional support and encouragement. Patient left group early and did not return.   Angie [name], MSW, [employer]CSWA Clinical Social Worker [facility] [phone]

## 2015-06-07 NOTE — Progress Notes (Signed)
D: Pt is alert and oriented x4. Pt complained of mild depression and anxiety; she states, "I am getting there; I am feeling much better." Pt denies SI, HI, AVH and pain. Pt was cooperative and appreciative and calm through the assessment.  A: Pt was encouraged to attend group. Medications offered as prescribed.  Support, encouragement, and safe environment provided.  15-minute safety checks continue.  R: Pt was med compliant.  Pt attended group. Safety checks continue

## 2015-06-07 NOTE — Progress Notes (Signed)
Nursing Note: 0700-1900  D:  Mood is depressed,sullen and anxious at times.  Affect is flat and depressed. Self Inventory- rates depression 7/10, hopelessness 7/10 and anxiety 7/10.  Pt states, "I am just so worried about where I am going after this.  I don't even know really what happened, I was confused but I didn't take medicines to cause this. I suffer from insomnia."  "I have messed up a lot in my life, but I wish I could get another chance." A:  Pt taking meds as prescribed.  Encouraged to verbalize needs and concerns, active listening and support provided.  Continued Q 15 minute safety checks.  Observed active participation in group settings.  R:  Pt. denies A/V hallucinations, SI/HI and is able to verbally contract for safety.

## 2015-06-07 NOTE — [facility] Group Notes (Signed)
   [facility]  06/07/2015  8:45 AM   Participation Quality: Alert, Appropriate and Oriented  Mood/Affect: Depressed and anxious  Depression Rating: 9  Anxiety Rating: 10  Thoughts of Suicide: Pt denies SI/HI  Will you contract for safety? Yes  Current AVH: Pt denies  Plan for Discharge/Comments: Pt attended discharge planning group and actively participated in group. CSW provided pt with today's workbook. Patient reports that she is unsure if she can return back to previous living situation as her family are not answering her calls. Patient may have to discharge to shelter to follow up with outpatient services if unable to return home.  Transportation Means: Pt reports access to transportation  Supports: No supports mentioned at this time  Samuella BruinKristin Salima Rumer, MSW, [employer]CSWA Clinical Social Worker [facility] [phone]

## 2015-06-08 DIAGNOSIS — F1999 Other psychoactive substance use, unspecified with unspecified psychoactive substance-induced disorder: Secondary | ICD-10-CM
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
## 2015-06-08 NOTE — B[facility] Group Notes (Signed)
[facility] Group Notes:  (Clinical Social Work)   06/01/2015     10:00-11:00AM  Summary of Progress/Problems:   In today's process group a decisional balance exercise was used to explore in depth the perceived benefits and costs of alcohol and drugs, as well as the  benefits and costs of replacing these with healthy coping skills.  Patients listed healthy and unhealthy coping techniques, determining with CSW guidance that unhealthy coping techniques work initially, but eventually become harmful.  Motivational Interviewing and the whiteboard were utilized for the exercises.  The patient expressed that the unhealthy coping she often uses is self medicating and negative self-talk.  Type of Therapy:  Group Therapy - Process   Participation Level:  Active  Participation Quality:  Attentive  Affect:  Blunted  Cognitive:  Appropriate  Insight:  Developing/Improving  Engagement in Therapy:  Engaged  Modes of Intervention:  Education, Motivational Interviewing  Ambrose MantleMareida Grossman-Orr, LCSW 06/08/2015, 12:43 PM

## 2015-06-08 NOTE — Progress Notes (Signed)
D: Pt is alert and oriented x4. Pt complained of moderate depression and anxiety and some worrying; she states, "I may be going home very soon; since am homeless, I don't know where I would be going; this has increased my depression and anxiety." Pt denies SI, HI, AVH and pain. Pt was cooperative and appreciative and calm through the assessment.  A: Pt was encouraged to attend group. Medications offered as prescribed.  Support, encouragement, and safe environment provided.  15-minute safety checks continue.  R: Pt was med compliant.  Pt attended group. Safety checks continue.

## 2015-06-08 NOTE — Progress Notes (Signed)
Patient ID: Angie [name], female   DOB: August 16, 1980, [age] y.o.   MRN: [id number]   Pt currently presents with a flat affect and anxious behavior. Per self inventory, pt rates depression at a 5, hopelessness 8 and anxiety 8. Pt's daily goal is "courage" and they intend to do so by "family support." Pt reports good sleep, a good appetite, low energy and good concentration. Pt attends and participates in groups today. Pt shows good insight. Pt still has poor hygiene.   Pt provided with medications per providers orders. Pt's labs and vitals were monitored throughout the day. Pt supported emotionally and encouraged to express concerns and questions. Pt educated on medications and alternative stress techniques.   Pt's safety ensured with 15 minute and environmental checks. Pt currently denies SI/HI and A/V hallucinations. Pt verbally agrees to seek staff if SI/HI or A/VH occurs and to consult with staff before acting on these thoughts. Pt to be discharged per MD. Pt reports anxiety about her discharge plan but is encouraged due to "my families support and understanding." Will continue POC.

## 2015-06-08 NOTE — Progress Notes (Signed)
Patient ID: Angie AstersSarah M [name], female   DOB: 01/29/1981, 34 y.o.   MRN: [id number]   Pt discharged home with a friend. Pt was stable and appreciative at that time. Pt given two envelopes addressed to her.  All papers and prescriptions were given and valuables returned. Verbal understanding expressed. Denies SI/HI and A/VH. Pt given opportunity to express concerns and ask questions.
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
## 2015-06-08 NOTE — Progress Notes (Signed)
Adult Psychoeducational Group Note  Date:  06/08/2015 Time:  09:30am  Group Topic/Focus:  Goals Group:   The focus of this group is to help patients establish daily goals to achieve during treatment and discuss how the patient can incorporate goal setting into their daily lives to aide in recovery.  Participation Level:  Active  Participation Quality:  Appropriate  Affect:  Appropriate  Cognitive:  Alert, Appropriate and Oriented  Insight: Good  Engagement in Group:  Engaged  Modes of Intervention:  Activity, Education, Orientation and Support  Additional Comments:  Pt able to identify one daily goal to accomplish today; pt will focus on positivity and communicate more efficiently with her family members.   Aurora Maskwyman, Aryani Daffern E 06/08/2015, 10:16 AM
